# Patient Record
Sex: Male | Born: 1935 | Race: White | Hispanic: No | Smoking: Former smoker
Health system: Southern US, Community
[De-identification: ages and names within clinical notes are randomized; demographics above are authoritative.]

## PROBLEM LIST (undated history)

## (undated) DIAGNOSIS — K259 Gastric ulcer, unspecified as acute or chronic, without hemorrhage or perforation: Secondary | ICD-10-CM

## (undated) DIAGNOSIS — I10 Essential (primary) hypertension: Secondary | ICD-10-CM

## (undated) DIAGNOSIS — I771 Stricture of artery: Secondary | ICD-10-CM

## (undated) DIAGNOSIS — Z8679 Personal history of other diseases of the circulatory system: Secondary | ICD-10-CM

## (undated) DIAGNOSIS — I2510 Atherosclerotic heart disease of native coronary artery without angina pectoris: Secondary | ICD-10-CM

## (undated) DIAGNOSIS — R200 Anesthesia of skin: Secondary | ICD-10-CM

## (undated) DIAGNOSIS — I714 Abdominal aortic aneurysm, without rupture, unspecified: Secondary | ICD-10-CM

## (undated) DIAGNOSIS — E785 Hyperlipidemia, unspecified: Secondary | ICD-10-CM

## (undated) DIAGNOSIS — J449 Chronic obstructive pulmonary disease, unspecified: Secondary | ICD-10-CM

## (undated) DIAGNOSIS — I6529 Occlusion and stenosis of unspecified carotid artery: Secondary | ICD-10-CM

## (undated) DIAGNOSIS — IMO0001 Reserved for inherently not codable concepts without codable children: Secondary | ICD-10-CM

## (undated) DIAGNOSIS — K219 Gastro-esophageal reflux disease without esophagitis: Secondary | ICD-10-CM

## (undated) DIAGNOSIS — I701 Atherosclerosis of renal artery: Secondary | ICD-10-CM

## (undated) DIAGNOSIS — E119 Type 2 diabetes mellitus without complications: Secondary | ICD-10-CM

## (undated) DIAGNOSIS — I739 Peripheral vascular disease, unspecified: Secondary | ICD-10-CM

## (undated) HISTORY — PX: CAROTID ENDARTERECTOMY: SUR193

## (undated) HISTORY — DX: Hyperlipidemia, unspecified: E78.5

## (undated) HISTORY — PX: CORONARY ARTERY BYPASS GRAFT: SHX141

## (undated) HISTORY — DX: Stricture of artery: I77.1

## (undated) HISTORY — PX: TONSILLECTOMY: SUR1361

## (undated) HISTORY — PX: EYE SURGERY: SHX253

## (undated) HISTORY — PX: ABDOMINAL AORTIC ANEURYSM REPAIR: SUR1152

## (undated) HISTORY — DX: Peripheral vascular disease, unspecified: I73.9

## (undated) HISTORY — DX: Atherosclerosis of renal artery: I70.1

## (undated) HISTORY — PX: COLONOSCOPY: SHX174

## (undated) HISTORY — DX: Atherosclerotic heart disease of native coronary artery without angina pectoris: I25.10

## (undated) HISTORY — DX: Type 2 diabetes mellitus without complications: E11.9

## (undated) HISTORY — DX: Chronic obstructive pulmonary disease, unspecified: J44.9

## (undated) HISTORY — DX: Essential (primary) hypertension: I10

## (undated) HISTORY — DX: Abdominal aortic aneurysm, without rupture, unspecified: I71.40

## (undated) HISTORY — DX: Occlusion and stenosis of unspecified carotid artery: I65.29

## (undated) HISTORY — DX: Abdominal aortic aneurysm, without rupture: I71.4

## (undated) HISTORY — DX: Personal history of other diseases of the circulatory system: Z86.79

---

## 1992-12-15 HISTORY — PX: BASAL CELL CARCINOMA EXCISION: SHX1214

## 1999-11-19 ENCOUNTER — Encounter: Admission: RE | Admit: 1999-11-19 | Discharge: 2000-02-17 | Payer: Self-pay | Admitting: Family Medicine

## 1999-11-26 ENCOUNTER — Encounter: Admission: RE | Admit: 1999-11-26 | Discharge: 1999-11-26 | Payer: Self-pay | Admitting: *Deleted

## 1999-11-26 ENCOUNTER — Encounter: Payer: Self-pay | Admitting: *Deleted

## 2002-03-10 ENCOUNTER — Emergency Department (HOSPITAL_COMMUNITY): Admission: EM | Admit: 2002-03-10 | Discharge: 2002-03-10 | Payer: Self-pay

## 2002-04-05 ENCOUNTER — Encounter: Admission: RE | Admit: 2002-04-05 | Discharge: 2002-04-05 | Payer: Self-pay | Admitting: Family Medicine

## 2002-04-05 ENCOUNTER — Encounter: Payer: Self-pay | Admitting: Family Medicine

## 2002-04-11 ENCOUNTER — Encounter: Admission: RE | Admit: 2002-04-11 | Discharge: 2002-04-11 | Payer: Self-pay

## 2003-11-02 ENCOUNTER — Encounter: Admission: RE | Admit: 2003-11-02 | Discharge: 2003-11-02 | Payer: Self-pay | Admitting: Cardiology

## 2004-10-16 ENCOUNTER — Ambulatory Visit: Payer: Self-pay | Admitting: *Deleted

## 2004-10-21 ENCOUNTER — Ambulatory Visit: Payer: Self-pay | Admitting: *Deleted

## 2004-11-14 ENCOUNTER — Ambulatory Visit (HOSPITAL_COMMUNITY): Admission: RE | Admit: 2004-11-14 | Discharge: 2004-11-14 | Payer: Self-pay | Admitting: Cardiology

## 2004-11-25 ENCOUNTER — Ambulatory Visit: Payer: Self-pay | Admitting: Cardiology

## 2004-12-27 ENCOUNTER — Ambulatory Visit (HOSPITAL_COMMUNITY): Admission: RE | Admit: 2004-12-27 | Discharge: 2004-12-27 | Payer: Self-pay | Admitting: *Deleted

## 2005-01-14 ENCOUNTER — Encounter (INDEPENDENT_AMBULATORY_CARE_PROVIDER_SITE_OTHER): Payer: Self-pay | Admitting: *Deleted

## 2005-01-14 ENCOUNTER — Inpatient Hospital Stay (HOSPITAL_COMMUNITY): Admission: RE | Admit: 2005-01-14 | Discharge: 2005-01-17 | Payer: Self-pay | Admitting: *Deleted

## 2005-01-14 HISTORY — PX: FEMORAL-FEMORAL BYPASS GRAFT: SHX936

## 2005-01-14 HISTORY — PX: FEMORAL ARTERY ANEURYSM REPAIR: SUR1157

## 2005-11-18 ENCOUNTER — Ambulatory Visit: Payer: Self-pay | Admitting: Cardiology

## 2005-11-27 ENCOUNTER — Ambulatory Visit: Payer: Self-pay

## 2005-12-31 ENCOUNTER — Emergency Department (HOSPITAL_COMMUNITY): Admission: EM | Admit: 2005-12-31 | Discharge: 2006-01-01 | Payer: Self-pay | Admitting: Emergency Medicine

## 2006-09-15 ENCOUNTER — Ambulatory Visit: Payer: Self-pay | Admitting: Family Medicine

## 2006-09-22 ENCOUNTER — Ambulatory Visit: Payer: Self-pay | Admitting: Family Medicine

## 2006-10-06 ENCOUNTER — Ambulatory Visit: Payer: Self-pay | Admitting: Internal Medicine

## 2006-10-20 ENCOUNTER — Encounter (INDEPENDENT_AMBULATORY_CARE_PROVIDER_SITE_OTHER): Payer: Self-pay | Admitting: *Deleted

## 2006-10-20 ENCOUNTER — Ambulatory Visit: Payer: Self-pay | Admitting: Internal Medicine

## 2006-11-11 ENCOUNTER — Ambulatory Visit: Payer: Self-pay | Admitting: Cardiology

## 2006-11-19 ENCOUNTER — Ambulatory Visit: Payer: Self-pay

## 2007-03-02 ENCOUNTER — Ambulatory Visit: Payer: Self-pay | Admitting: Family Medicine

## 2007-03-09 ENCOUNTER — Encounter: Admission: RE | Admit: 2007-03-09 | Discharge: 2007-03-09 | Payer: Self-pay | Admitting: Family Medicine

## 2007-10-13 ENCOUNTER — Ambulatory Visit: Payer: Self-pay | Admitting: Cardiology

## 2007-12-22 ENCOUNTER — Ambulatory Visit: Payer: Self-pay | Admitting: Family Medicine

## 2007-12-23 ENCOUNTER — Ambulatory Visit: Payer: Self-pay | Admitting: *Deleted

## 2007-12-23 ENCOUNTER — Ambulatory Visit: Payer: Self-pay

## 2008-01-20 ENCOUNTER — Ambulatory Visit: Payer: Self-pay | Admitting: Family Medicine

## 2008-06-07 ENCOUNTER — Ambulatory Visit: Payer: Self-pay

## 2008-11-01 ENCOUNTER — Encounter: Payer: Self-pay | Admitting: Cardiology

## 2008-11-02 ENCOUNTER — Ambulatory Visit: Payer: Self-pay | Admitting: Cardiology

## 2008-11-13 ENCOUNTER — Ambulatory Visit: Payer: Self-pay

## 2009-01-10 ENCOUNTER — Ambulatory Visit: Payer: Self-pay | Admitting: Family Medicine

## 2009-01-17 ENCOUNTER — Ambulatory Visit: Payer: Self-pay | Admitting: *Deleted

## 2009-03-05 ENCOUNTER — Ambulatory Visit: Payer: Self-pay | Admitting: Family Medicine

## 2009-03-27 ENCOUNTER — Telehealth (INDEPENDENT_AMBULATORY_CARE_PROVIDER_SITE_OTHER): Payer: Self-pay | Admitting: *Deleted

## 2009-04-02 ENCOUNTER — Ambulatory Visit: Payer: Self-pay | Admitting: Family Medicine

## 2009-04-13 DIAGNOSIS — E785 Hyperlipidemia, unspecified: Secondary | ICD-10-CM | POA: Insufficient documentation

## 2009-04-13 DIAGNOSIS — I739 Peripheral vascular disease, unspecified: Secondary | ICD-10-CM

## 2009-04-13 DIAGNOSIS — E118 Type 2 diabetes mellitus with unspecified complications: Secondary | ICD-10-CM | POA: Insufficient documentation

## 2009-04-13 DIAGNOSIS — I10 Essential (primary) hypertension: Secondary | ICD-10-CM | POA: Insufficient documentation

## 2009-04-13 DIAGNOSIS — I6523 Occlusion and stenosis of bilateral carotid arteries: Secondary | ICD-10-CM

## 2009-04-13 DIAGNOSIS — I2510 Atherosclerotic heart disease of native coronary artery without angina pectoris: Secondary | ICD-10-CM | POA: Insufficient documentation

## 2009-04-16 ENCOUNTER — Ambulatory Visit: Payer: Self-pay

## 2009-04-16 ENCOUNTER — Ambulatory Visit: Payer: Self-pay | Admitting: Cardiology

## 2009-04-16 ENCOUNTER — Telehealth: Payer: Self-pay | Admitting: Cardiology

## 2009-04-26 ENCOUNTER — Ambulatory Visit: Payer: Self-pay | Admitting: Family Medicine

## 2009-05-03 ENCOUNTER — Encounter: Payer: Self-pay | Admitting: Cardiology

## 2009-08-06 ENCOUNTER — Ambulatory Visit: Payer: Self-pay | Admitting: Family Medicine

## 2009-10-15 ENCOUNTER — Encounter (INDEPENDENT_AMBULATORY_CARE_PROVIDER_SITE_OTHER): Payer: Self-pay | Admitting: *Deleted

## 2009-10-16 ENCOUNTER — Encounter: Payer: Self-pay | Admitting: Cardiology

## 2009-10-17 ENCOUNTER — Ambulatory Visit: Payer: Self-pay | Admitting: Cardiology

## 2009-10-17 ENCOUNTER — Ambulatory Visit: Payer: Self-pay

## 2009-10-17 DIAGNOSIS — I714 Abdominal aortic aneurysm, without rupture, unspecified: Secondary | ICD-10-CM | POA: Insufficient documentation

## 2009-11-07 ENCOUNTER — Encounter: Payer: Self-pay | Admitting: Cardiology

## 2009-11-07 ENCOUNTER — Ambulatory Visit: Payer: Self-pay

## 2009-11-07 DIAGNOSIS — I701 Atherosclerosis of renal artery: Secondary | ICD-10-CM

## 2010-02-20 ENCOUNTER — Ambulatory Visit: Payer: Self-pay | Admitting: Family Medicine

## 2010-04-18 ENCOUNTER — Ambulatory Visit: Payer: Self-pay

## 2010-04-18 ENCOUNTER — Encounter: Payer: Self-pay | Admitting: Cardiology

## 2010-06-13 ENCOUNTER — Ambulatory Visit: Payer: Self-pay | Admitting: Family Medicine

## 2010-06-20 ENCOUNTER — Ambulatory Visit: Payer: Self-pay | Admitting: Family Medicine

## 2010-06-27 ENCOUNTER — Ambulatory Visit: Payer: Self-pay | Admitting: Family Medicine

## 2010-07-01 ENCOUNTER — Ambulatory Visit: Payer: Self-pay | Admitting: Family Medicine

## 2010-10-01 ENCOUNTER — Ambulatory Visit: Payer: Self-pay | Admitting: Family Medicine

## 2010-10-01 ENCOUNTER — Encounter: Admission: RE | Admit: 2010-10-01 | Discharge: 2010-10-01 | Payer: Self-pay | Admitting: Family Medicine

## 2010-10-08 DIAGNOSIS — I2510 Atherosclerotic heart disease of native coronary artery without angina pectoris: Secondary | ICD-10-CM

## 2010-10-09 ENCOUNTER — Ambulatory Visit: Payer: Self-pay | Admitting: Cardiology

## 2010-10-11 ENCOUNTER — Encounter: Payer: Self-pay | Admitting: Cardiology

## 2010-11-13 ENCOUNTER — Encounter: Payer: Self-pay | Admitting: Cardiology

## 2010-11-14 ENCOUNTER — Ambulatory Visit: Payer: Self-pay

## 2010-11-14 ENCOUNTER — Telehealth (INDEPENDENT_AMBULATORY_CARE_PROVIDER_SITE_OTHER): Payer: Self-pay | Admitting: *Deleted

## 2010-11-14 ENCOUNTER — Encounter: Payer: Self-pay | Admitting: Cardiology

## 2010-11-26 ENCOUNTER — Ambulatory Visit: Payer: Self-pay | Admitting: Cardiology

## 2010-11-26 ENCOUNTER — Ambulatory Visit: Payer: Self-pay | Admitting: Family Medicine

## 2010-11-28 LAB — CONVERTED CEMR LAB
Alkaline Phosphatase: 66 units/L (ref 39–117)
Bilirubin, Direct: 0.1 mg/dL (ref 0.0–0.3)
CO2: 28 meq/L (ref 19–32)
Calcium: 9.5 mg/dL (ref 8.4–10.5)
Creatinine, Ser: 1.2 mg/dL (ref 0.4–1.5)
GFR calc non Af Amer: 61.51 mL/min (ref >60.00)
Glucose, Bld: 151 mg/dL — ABNORMAL HIGH (ref 70–99)
LDL Cholesterol: 117 mg/dL — ABNORMAL HIGH (ref 0–99)
Total Bilirubin: 0.6 mg/dL (ref 0.3–1.2)
Total CHOL/HDL Ratio: 6

## 2011-01-04 ENCOUNTER — Encounter: Payer: Self-pay | Admitting: Cardiology

## 2011-01-16 NOTE — Miscellaneous (Signed)
Summary: Orders Update  Clinical Lists Changes  Orders: Added new Test order of Carotid Duplex (Carotid Duplex) - Signed 

## 2011-01-16 NOTE — Assessment & Plan Note (Signed)
Summary: rov. gd   Visit Type:  rov Primary Provider:  Sharlot Gowda MD  CC:  sob...Chad Kitchenpt states he had a little tingling feeling in his LUQ recently but says this is better now....denies any edema.  History of Present Illness: Mr. Billey returns today for evaluation and management of his complex vascular history. Please refer to his problem list.  He is less depressed the last time I saw him. He has a male companion which seems to have helped. He has lost 20 some pounds.  He denies any angina or ischemic symptoms. He's had no claudication or mini stroke symptoms. Vascular studies were recently stable. Refer to those reports.  His high-dose simvastatin we'll stop the other day by primary care. Apparently his creatinine has worsened as well.  Clinical Reports Reviewed:  Carotid Doppler:  04/18/2010:  Impressions: Stable, moderate carotid artery disease, bilaterally. 40-59% RICA stenosis. 60-79% LICA stenosis.  Charlton Haws, MD  04/16/2009:  Impressions: Moderate irregular plaque throughout both carotid arteries, with progression of stenosis, bilaterally, which could be an embolic source. 40-59% RICA stenosis 60-79% LICA stenosis  Charlton Haws, MD  06/07/2008:  Impressions: Stable, moderate, irregular, calcified plaque with shadowing, bilaterally. Severly elevated ECA velocities, bilaterally. 0-39% RICA stenosis 40-59% LICA stenosis  Tonny Bollman, MD  12/23/2007:  Impressions: Moderate calcified irregular plaque in the bulb and bifurcation, bilaterally. Severely elevated ECA velocities, bilaterally. 0-39% RICA stenosis 60-79% LICA stenosis  Lenice Llamas, MD   Current Medications (verified): 1)  Diltiazem Hcl 60 Mg Tabs (Diltiazem Hcl) .[name].. 1 Tab Once Daily 2)  Niaspan 500 Mg Cr-Tabs (Niacin (Antihyperlipidemic)) .[name].. 1 Tab At Bedtime 3)  Aspirin Ec 325 Mg Tbec (Aspirin) .... Take One Tablet By Mouth Daily 4)  Multivitamins   Tabs (Multiple Vitamin) .[name].. 1 Tab Once  Daily 5)  Enalapril Maleate 20 Mg Tabs (Enalapril Maleate) .[name].. 1 Tab Once Daily 6)  Metformin Hcl 1000 Mg Tabs (Metformin Hcl) .[name].. 1 Tab Two Times A Day 7)  Nitroglycerin 0.4 Mg Subl (Nitroglycerin) .... One Tablet Under Tongue Every 5 Minutes As Needed For Chest Pain---May Repeat Times Three 8)  Novolin R 100 Unit/ml Soln (Insulin Regular Human) .... Use As Directed  Allergies (verified): No Known Drug Allergies  Past History:  Past Medical History: Last updated: 10/08/2010 CAD, NATIVE VESSEL (ICD-414.01) RENAL ARTERY ATHEROSCLEROSIS (ICD-440.1) ABDOMINAL AORTIC ANEURYSM (ICD-441.4) CAROTID ARTERY STENOSIS (ICD-433.10) ATHEROSCLEROTIC CARDIOVASCULAR DISEASE (ICD-429.2) HYPERTENSION, UNSPECIFIED (ICD-401.9) ARTERIAL STENOSIS (ICD-447.1) PVD (ICD-443.9) HYPERLIPIDEMIA-MIXED (ICD-272.4) DM (ICD-250.00)  Past Surgical History: Last updated: 04-May-2009 Resection of left common femoral artery aneurysm.  01/14/2005 Left-to-right femoral-femoral bypass.  01/14/2005 Tonsillectomy s/p basal cell excision from left lower eyelid... 1994 Coronary artery bypass graft  Family History: Last updated: 05-04-2009 Mother: deceased at 81...melanoma.Chad Kitchenalso had heart disease and non isulin diabetes as well as ETOH abuser Father: deceased at 6...hx of heart disease Family History of Diabetes: maternal cousins Family History of Alcoholism: mother  Social History: Last updated: 05-04-2009 Full Time Tobacco Use - Former.  Alcohol Use - yes  Risk Factors: Smoking Status: quit (2009/05/04)  Review of Systems       negative other than history of present illness  Vital Signs:  Patient profile:   77 year old male Height:      71 inches Weight:      217.8 pounds BMI:     30.49 Pulse rate:   69 / minute Pulse rhythm:   irregular BP sitting:   108 / 56  (left arm) Cuff size:  large  Vitals Entered By: Danielle Rankin, CMA (October 09, 2010 10:35 AM)  Physical Exam  General:  Well  developed, well nourished, in no acute distress. Head:  normocephalic and atraumatic Eyes:  PERRLA/EOM intact; conjunctiva and lids normal. Neck:  Neck supple, no JVD. No masses, thyromegaly or abnormal cervical nodes. Chest Wall:  no deformities or breast masses noted Lungs:  Clear bilaterally to auscultation and percussion. Heart:  MI nondisplaced, soft S1-S2, no murmur rub or gallop, soft carotid bruits Abdomen:  incisional hernia, positive bowel sounds, no obvious bruits Msk:  decreased ROM.   Pulses:  dorsalis pedis 2+ over 4+ bilaterally Extremities:  No clubbing or cyanosis. Neurologic:  Alert and oriented x 3. Skin:  Intact without lesions or rashes. Psych:  Normal affect.   EKG  Procedure date:  10/09/2010  Findings:      normal sinus rhythm, nonspecific ST and T-wave changes.  Impression & Recommendations:  Problem # 1:  CAD, NATIVE VESSEL (ICD-414.01) Assessment Unchanged  His updated medication list for this problem includes:    Diltiazem Hcl 60 Mg Tabs (Diltiazem hcl) .[name]... 1 tab once daily    Aspirin Ec 325 Mg Tbec (Aspirin) .[name]... Take one tablet by mouth daily    Enalapril Maleate 20 Mg Tabs (Enalapril maleate) .[name]... 1 tab once daily    Nitroglycerin 0.4 Mg Subl (Nitroglycerin) ..... One tablet under tongue every 5 minutes as needed for chest pain---may repeat times three  Orders: EKG w/ Interpretation (93000)  Problem # 2:  RENAL ARTERY ATHEROSCLEROSIS (ICD-440.1) Assessment: Unchanged  Problem # 3:  ABDOMINAL AORTIC ANEURYSM (ICD-441.4)  Problem # 4:  CAROTID ARTERY STENOSIS (ICD-433.10) Assessment: Unchanged repeat carotids in May of 2012 His updated medication list for this problem includes:    Aspirin Ec 325 Mg Tbec (Aspirin) .[name]... Take one tablet by mouth daily  Problem # 5:  PVD (ICD-443.9) Assessment: Unchanged  Problem # 6:  HYPERTENSION, UNSPECIFIED (ICD-401.9) Assessment: Improved  His updated medication list for this problem includes:     Diltiazem Hcl 60 Mg Tabs (Diltiazem hcl) .[name]... 1 tab once daily    Aspirin Ec 325 Mg Tbec (Aspirin) .[name]... Take one tablet by mouth daily    Enalapril Maleate 20 Mg Tabs (Enalapril maleate) .[name]... 1 tab once daily  Problem # 7:  HYPERLIPIDEMIA-MIXED (ICD-272.4) Assessment: Deteriorated Will restart him on Lipitor 40 mg a day. Followup labs in 6-8 weeks. The following medications were removed from the medication list:    Simvastatin 80 Mg Tabs (Simvastatin) .[name]... 1 tab at bedtime His updated medication list for this problem includes:    Niaspan 500 Mg Cr-tabs (Niacin (antihyperlipidemic)) .[name]... 1 tab at bedtime    Lipitor 40 Mg Tabs (Atorvastatin calcium) .[name]... Take 1 tablet at bedtime  Problem # 8:  DM (ICD-250.00) Assessment: Deteriorated  The following medications were removed from the medication list:    Glyburide 5 Mg Tabs (Glyburide) .[name]... 2 tabs bid His updated medication list for this problem includes:    Aspirin Ec 325 Mg Tbec (Aspirin) .[name]... Take one tablet by mouth daily    Enalapril Maleate 20 Mg Tabs (Enalapril maleate) .[name]... 1 tab once daily    Metformin Hcl 1000 Mg Tabs (Metformin hcl) .[name]... 1 tab two times a day    Novolin R 100 Unit/ml Soln (Insulin regular human) ..... Use as directed  Patient Instructions: 1)  Your physician recommends that you schedule a follow-up appointment in: 6 months with Dr. Daleen Squibb 2)  Your physician  recommends that you return for a FASTING lipid profile, liver, bmp in 6-8 weeks.  3)  Your physician has recommended you make the following change in your medication: Start Lipitor 4)  Your physician has requested that you have a carotid duplex. This test is an ultrasound of the carotid arteries in your neck. It looks at blood flow through these arteries that supply the brain with blood. Allow one hour for this exam. There are no restrictions or special instructions.  SCHEDULE FOR MAY 2012 Prescriptions: LIPITOR 40 MG TABS (ATORVASTATIN CALCIUM) Take  1 tablet at bedtime  #30 x 11   Entered by:   Lisabeth Devoid RN   Authorized by:   Gaylord Shih, MD, [facility]   Signed by:   Lisabeth Devoid RN on 10/09/2010   Method used:   Electronically to        [facility]  [address] [id number] (retail)       [address]       [place], [place]  [address]       Ph: [phone]       Fax: [phone]   RxID:   [id number]

## 2011-01-16 NOTE — Progress Notes (Signed)
  Walk in Patient Form Recieved " Pt has questions reguarding Labs" sent to [facility]  November 14, 2010 2:24 PM     Appended Document:  I talked with pt and he is aware he needs follow-up lipids & liver since starting Lipitor in late October.  He will keep 11/26/10 appt. Mylo Red RN

## 2011-02-17 ENCOUNTER — Ambulatory Visit (INDEPENDENT_AMBULATORY_CARE_PROVIDER_SITE_OTHER): Payer: Medicare Other | Admitting: Family Medicine

## 2011-02-17 DIAGNOSIS — Z79899 Other long term (current) drug therapy: Secondary | ICD-10-CM

## 2011-02-17 DIAGNOSIS — E119 Type 2 diabetes mellitus without complications: Secondary | ICD-10-CM

## 2011-02-17 DIAGNOSIS — L258 Unspecified contact dermatitis due to other agents: Secondary | ICD-10-CM

## 2011-02-17 DIAGNOSIS — E785 Hyperlipidemia, unspecified: Secondary | ICD-10-CM

## 2011-03-27 ENCOUNTER — Ambulatory Visit (INDEPENDENT_AMBULATORY_CARE_PROVIDER_SITE_OTHER): Payer: Medicare Other | Admitting: Family Medicine

## 2011-03-27 DIAGNOSIS — Z79899 Other long term (current) drug therapy: Secondary | ICD-10-CM

## 2011-03-27 DIAGNOSIS — E782 Mixed hyperlipidemia: Secondary | ICD-10-CM

## 2011-03-27 DIAGNOSIS — I10 Essential (primary) hypertension: Secondary | ICD-10-CM

## 2011-03-27 DIAGNOSIS — E119 Type 2 diabetes mellitus without complications: Secondary | ICD-10-CM

## 2011-04-29 ENCOUNTER — Other Ambulatory Visit: Payer: Self-pay | Admitting: Cardiology

## 2011-04-29 DIAGNOSIS — I6529 Occlusion and stenosis of unspecified carotid artery: Secondary | ICD-10-CM

## 2011-04-29 NOTE — Procedures (Signed)
LOWER EXTREMITY ARTERIAL EVALUATION-SINGLE LEVEL   INDICATION:  Complaining of both feet feeling cold at night.  He denies  claudication.   HISTORY:  Diabetes:  Yes, on insulin.  Cardiac:  CABG in 1996 by Dr. Tyrone Sage.  Hypertension:  Yes.  Smoking:  No.  Previous Surgery:  Aortoiliac bypass graft in 1996 by Dr. Madilyn Fireman,  resection of left common femoral artery aneurysm, left-to-right femoral-  to-femoral bypass graft by Dr. Madilyn Fireman.   OTHER REFERRING PHYSICIAN:  Sharlot Gowda, M.D.   RESTING SYSTOLIC PRESSURES: (ABI)                          RIGHT                LEFT  Brachial:               140                  140  Anterior tibial:        132                  124  Posterior tibial:       164 (>1.0)           134 (0.96)  Peroneal:  DOPPLER WAVEFORM ANALYSIS:  Anterior tibial:        Biphasic             Biphasic  Posterior tibial:       Triphasic            Triphasic  Peroneal:   PREVIOUS ABI'S:  Date: 03/09/2007  RIGHT:  >1.0  LEFT:  >1.0  This study was done at [facility].   IMPRESSION:  No evidence of lower extremity arterial occlusive disease  bilaterally.   ___________________________________________  P. Liliane Bade, M.D.   DP/MEDQ  D:  12/23/2007  T:  12/23/2007  Job:  [id number]

## 2011-04-29 NOTE — Assessment & Plan Note (Signed)
[facility]                            CARDIOLOGY OFFICE NOTE   NAME:Strauser, ENCARNACION SCIONEAUX                        MRN:          [id number]  DATE:10/13/2007                            DOB:          October 25, 1930    Mr. [name] returns today for management of the following issues:  1. Coronary artery disease.  He is having no angina.  He does have      some dyspnea on exertion which is baseline.  Stress Myoview on      November 19, 2006 showed an EF of 48%, inferior wall and septum      scar.  No ischemia.  2. Peripheral vascular disease.  He has had an aortobifemoral and      bifemoral renal artery grafting by Dr. Liliane Bade in January, 2006.      He also has had a left-to-right femoral-femoral bypass to include a      right iliac limb.  He has been having some cold sensation in his      left foot.  It hurts when he lays down at night.  It does not seem      to bother him too much up and about.  3. Mixed hyperlipidemia, followed by Dr. Susann Givens.  4. Type 2 diabetes.  Recent blood work for his lipids and both his      hemoglobin A1C at the [facility] showed a total cholesterol of      149, blood sugar fasting at 157, HCL of 29, LDL of 80,      triglycerides of 199.  His hemoglobin A1C was 7.5%.   MEDICATIONS:  1. Diltiazem 60 mg a day.  2. Metformin 500 mg b.i.d.  3. Vytorin 10/80 nightly.  4. Niaspan 1000 mg nightly.  5. Glyburide 10 mg p.o. b.i.d.  6. Insulin.  7. Omega 3.  8. Enalapril 20 mg a day.  9. Aspirin 81 mg a day nightly.  10.Multivitamin.   He also has a dry cough, but it does not keep him awake and bother him  during the day.  It seems to bother him most in the morning when he  first gets up.  He also has nonobstructive carotid disease that was  picked up on exam last year.  His carotid Dopplers showed nonobstructive  plaque in both internal carotid arteries as well as antegrade flow in  both vertebrals.   PHYSICAL EXAMINATION:  Blood  pressure 118/70, pulse 76 and regular.  His  weight is 233.  Respiratory rate is 18.  Alert and oriented.  HEENT:  Normocephalic and atraumatic.  PERRLA.  Extraocular movements  are intact.  Skin is warm and dry.  His skin color looks the best I have  seen it.  HEENT otherwise unremarkable.  Carotid upstrokes are equal bilaterally with a soft carotid bruit on the  right side.  Thyroid is not enlarged.  Trachea is midline.  LUNGS:  Clear.  His sternotomy site is well healed.  He has a soft S1 and S2.  PMI is hard to appreciate.  ABDOMEN:  Soft with  good bowel sounds.  There is no tenderness.  No  hepatomegaly.  EXTREMITIES:  His toes are slightly cool.  I cannot appreciate a good  dorsalis pedis or posterior tibial pulse on the left.  He has minimal  edema.  There are no varicosities.  There is no DVT.  His right lower  extremity is the same temperature, but he has at least a 1-2+ dorsalis  pedis pulse.  Posterior tibial was at least 1+.  NEURO:  Intact.   ASSESSMENT/PLAN:  From a cardiac standpoint, Mr. Portal is stable.  He  needs carotid Dopplers in December.  I have advised him to go back to  see Dr. Liliane Bade about his left foot and leg.  I do not see any acute  issue but certainly, he needs followup.   I will plan on seeing him back again in a year.     Thomas C. Daleen Squibb, MD, [facility]  Electronically Signed    TCW/MedQ  DD: 10/13/2007  DT: 10/14/2007  Job #: [id number]   cc:   Sharlot Gowda, M.D.  Balinda Quails, M.D.

## 2011-04-29 NOTE — Assessment & Plan Note (Signed)
[facility]                            CARDIOLOGY OFFICE NOTE   NAME:Mcneice, WALLIS VANCOTT                        MRN:          [id number]  DATE:11/02/2008                            DOB:          02-29-1930    Mr. Yurkovich comes in today for followup.   His biggest complaint is that of domestic stress.  His child lost their  job, moved back into their home with 3 children.  He has been extremely  stressful.   He denies any angina or ischemic symptoms.  He did follow up with Dr.  Madilyn Fireman as I suggested on his last visit on October 13, 2007, concerning  his peripheral vascular disease.   His problems are listed on my note, October 13, 2007.  He  went to the  [facility] in [place] yesterday and had fasting blood work done.  Obviously,  he does not have the results nor do I.   His medications since last year I have changed in that he is no longer  on Vytorin.  I do not see an appropriate substitute from his med list.  [facility] or [facility].   He is now on metformin 1000 b.i.d. as opposed to 500 b.i.d. and his  enalapril has been bumped up to 40 mg at    INCOMPLETE DICTATION.     Thomas C. Daleen Squibb, MD, [facility]  Electronically Signed    TCW/MedQ  DD: 11/02/2008  DT: 11/02/2008  Job #: [id number]

## 2011-04-30 ENCOUNTER — Encounter: Payer: Medicare Other | Admitting: Cardiology

## 2011-05-02 NOTE — Op Note (Signed)
NAMEBECKY, BERBERIAN NO.:  [id number]   MEDICAL RECORD NO.:  [id number]          PATIENT TYPE:  INP   LOCATION:  3302                         FACILITY:  [facility]   PHYSICIAN:  Balinda Quails, M.D.    DATE OF BIRTH:  September 10, 1930   DATE OF PROCEDURE:  01/14/2005  DATE OF DISCHARGE:                                 OPERATIVE REPORT   SURGEON:  Balinda Quails, M.D.   ASSISTANT:  Toribio Harbour, N.P.   ANESTHESIA:  General endotracheal.   ANESTHESIOLOGIST:  Jean Rosenthal.   PREOPERATIVE DIAGNOSES:  1.  Left common femoral artery aneurysm.  2.  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   PROCEDURE:  1.  Resection of left common femoral artery aneurysm.  2.  Left-to-right femoral-femoral bypass.   CLINICAL NOTE:  Mr. Sergio pain is a 77 year old male with a history of  abdominal aortic aneurysm repair approximately 10 years ago. At that time,  he had an aortobi-iliac graft placed. He was recently seen in the office  with new onset of right lower extremity claudication. Arteriography revealed  occlusion of the right limb of his aortofemoral graft along with a small  left common femoral artery aneurysm. He is brought to the operating this  time for repair of left common femoral artery aneurysm and left-to-right  femoral-femoral bypass for relief of claudication.   OPERATIVE PROCEDURE:  The patient was brought to the operating in a stable  condition. Placed in supine position.  General endotracheal anesthesia  induced. The abdomen and both legs prepped and draped in sterile fashion.   Bilateral oblique groin incisions were made at the inguinal ligament.  Dissection carried down through subcutaneous tissue and lymphatics with  electrocautery.   The left groin dissection was carried down to expose the left common femoral  artery. The ligament freed and the external oblique aponeurosis exposed.  There was an approximately 3 cm aneurysm in the left common  femoral artery.  The proximal left common femoral artery encircled with a vessel loop. Distal  dissection carried down to expose the origin of the profunda and superficial  femoral arteries, which also were encircled with vessel loops.   In the right groin, deep dissection was carried through the lymphatics and  subcutaneous tissue to expose the right common femoral artery. This revealed  no palpable pulse. The artery was normal in caliber. The distal right common  femoral artery was freed. The origin of the profunda and superficial femoral  arteries were also freed. The proximal right superficial femoral artery was  exposed down to the first branch.   A subcutaneous suprapubic tunnel was then made between the two incisions. An  8 mm Dacron Hemashield graft was placed in the tunnel. The patient  administered 7000 units heparin intravenously. The left femoral vessel was  controlled with clamps. The left common femoral artery was opened  longitudinally. The aneurysmal segment was resected down to normal wall. The  left limb of the fem-fem graft was beveled and anastomosed end-to-side to  the left common femoral artery using running 5-0 Prolene  suture. At the  completion of the femoral anastomosis, the vessels were all flushed, clamps  removed, and the graft controlled with a fistula clamp.   In the right groin, the femoral vessels controlled with clamps. The  longitudinal arteriotomy made. The distal right common femoral artery  extended across the origin of the right superficial femoral artery. The  right limb of the graft beveled and anastomosed end-to-side to the right  common femoral/superficial femoral artery junction using running 6-0 Prolene  suture. At the completion of this, the graft was flushed. Clamps removed.  Excellent flow present. Adequate hemostasis obtained. Sponge and instrument  counts correct. The patient administered 50 mg of protamine intravenously.  The groin  incisions were irrigated with saline solution.   The groins were closed bilaterally with a deep layer of interrupted 2-0  Vicryl suture. Subcutaneous layer of running two Vicryl suture. Skin closed  with 4-0 Monocryl. Steri-Strips applied.   The patient tolerated procedure well. Transferred to recovery in stable  condition.      PGH/MEDQ  D:  01/14/2005  T:  01/14/2005  Job:  [id number]   cc:   Thomas C. Wall, M.D.

## 2011-05-02 NOTE — H&P (Signed)
NAMEGAYLIN, [name] NO.:  [id number]   MEDICAL RECORD NO.:  [id number]          PATIENT TYPE:  INP   LOCATION:  NA                           FACILITY:  [facility]   PHYSICIAN:  [name], M.D.    DATE OF BIRTH:  March 19, 1930   DATE OF ADMISSION:  01/14/2005  DATE OF DISCHARGE:                                HISTORY & PHYSICAL   CARDIOLOGIST:  Dr. Valera Mora   CHIEF COMPLAINT:  Right lower extremity claudication.   HISTORY OF PRESENT ILLNESS:  This is a 77 year old Caucasian male with  peripheral vascular disease.  He is status post abdominal aortic aneurysm  repair with bilateral common iliac aneurysm repair with an aorto biiliac  bypass graft as well as a left aortorenal bypass in 1996.  The left  aortorenal bypass subsequently occluded and the patient now has an atrophic  left kidney.  Patient began to complain of right lower extremity  claudication which has been going on for a little more than a year including  right hip pain with ambulation.  The patient denies rest pain.  ABIs  performed on November 25, 2004 reveal 0.58 on the right and greater than 1  on the left.  There are monophasic wave forms throughout the right lower  extremity.  He is status post coronary artery bypass grafting in 1996 by Dr.  Tyrone Mora.  His stress Cardiolite performed in 2004 revealed an ejection  fraction of 60% and no ischemia.  An abdominal and pelvic CT scan revealed  the aneurysm repairs were intact and that there was an occlusion of the  proximal right common iliac artery graft with reconstitution distally.  An  aortogram was then performed on December 27, 2004 which also revealed an  occlusion of the right limb of the aorto bi-iliac bypass graft.  Patient  does complain of hip pain with ambulation as well as some low back pain.  He  denies rest pain, erythema, edema, ulcerations, tenderness, fever, chills,  mental status changes, shortness of breath, angina, and  palpitations.  He  does also experience decreased temperature in the right lower extremity.  Patient presents to the CVTS office today for history and physical  examination prior to surgery.   PAST MEDICAL HISTORY:  1.  Peripheral vascular disease.  2.  Coronary artery disease.  3.  Hyperlipidemia.  4.  Non-insulin-dependent diabetes mellitus.  5.  Hypertension.  6.  Atrophic left kidney.  7.  Ventral hernia.  8.  Basal cell carcinoma.   PAST SURGICAL HISTORY:  1.  Status post aorto biiliac bypass graft with an aortorenal bypass in      1996.  2.  Coronary artery bypass grafting in 1996 by Dr. Tyrone Mora.  3.  Basal cell carcinoma removal x2 in the year 2000.  4.  Tonsillectomy.   MEDICATIONS:  1.  Metformin 500 mg t.i.d.  2.  Nifedipine 30 mg daily.  3.  Omeprazole 20 mg daily.  4.  Simvastatin 80 mg q.h.s.  5.  Enalapril 20 mg daily.  6.  Glyburide 10 mg with breakfast.  7.  HCTZ 25 mg one-half tablet daily.  8.  Zetia 10 mg daily.   ALLERGIES:  No known drug allergies.   REVIEW OF SYSTEMS:  Please see HPI for significant positives and negatives.   FAMILY HISTORY:  Mother:  Cardiac disease and melanoma.  Father:  Cardiac  disease.   SOCIAL HISTORY:  This is a married male with five children.  He is retired.  Patient denies alcohol use and quit smoking in 1986 after smoking one-half  to one pack per day for approximately 45 years.   PHYSICAL EXAMINATION:  VITAL SIGNS:  Blood pressure 120/62, pulse 86,  respirations 18.  GENERAL:  This is a 77 year old Caucasian male in no acute distress.  He is  alert and oriented x3.  HEENT:  Normocephalic, atraumatic.  Pupils are equal, round, and reactive to  light and accommodation.  Extraocular movements are intact.  There is no  evidence of cataract, glaucoma, or macular degeneration.  NECK:  Supple with no JVD, no bruits, and no lymphadenopathy.  CHEST:  Symmetrical on inspiration and there is a well healed midline scar   present.  LUNGS:  No wheezes.  No rhonchi.  No rales.  CARDIAC:  Regular rate and rhythm with no murmurs, no rubs, no gallops.  ABDOMEN:  Soft and nontender.  There are bowel sounds auscultated in four  quadrants.  There is a large midline well healed scar present and a ventral  hernia is palpable.  There are no bruits auscultated.  GENITOURINARY:  Deferred.  RECTAL:  Deferred.  EXTREMITIES:  No clubbing.  No cyanosis.  No ulcerations.  There is trace  bilateral lower extremity edema.  The temperature of the left lower  extremity is warm and it is decreased on the right lower extremity.  PULSES:  Carotid 2+ bilaterally.  Femoral is 2+ on the left, not palpable on  the right.  Popliteal and pedal pulses are 2+ on the left and not palpable  on the right.  NEUROLOGIC:  Nonfocal.  The gait is steady.  Deep tendon reflexes are 2+.  Muscle strength is 4/5.   ASSESSMENT:  Occluded right limb of aorto biiliac bypass graft.   PLAN:  Left to right femoral to femoral bypass graft at [facility] on  January 14, 2005 by Dr. Madilyn Mora.  Dr. Madilyn Mora has seen and evaluated this  patient prior to this admission and has explained the risks and benefits of  the procedure.  The patient has agreed to continue.      AY/MEDQ  D:  01/10/2005  T:  01/10/2005  Job:  [id number]

## 2011-05-02 NOTE — Assessment & Plan Note (Signed)
[facility]                            CARDIOLOGY OFFICE NOTE   NAME:Luddy, TYE VIGO                        MRN:          [id number]  DATE:11/11/2006                            DOB:          08-May-1930    Mr. Gaxiola returns today for further management of the following issues:  1. Coronary artery disease.  Other than some dyspnea on exertion he is      asymptomatic.  He had a negative stress Myoview last year.  He has      normal left ventricular systolic function.  2. Peripheral vascular disease, status post aortobifemoral and      bilateral renal artery grafting.  He has also had a left-to-right      femoral-to-femoral bypass for an occluded right iliac limb January 14, 2005.  He saw Dr. Madilyn Fireman earlier this year in followup.  3. Mixed hyperlipidemia.  He brings labs to me from Dr. Jola Babinski      office.  His lipids are very poorly controlled, even though he says      he is taking his Vytorin 10/80 every night, and he is on Niaspan      1000 mg nightly.  Please refer to the labs for numbers.  4. Type 2 diabetes.  His hemoglobin A1c is at an all-time high at      8.0%.  His weight has actually dropped, but he is now on insulin.   MEDICATIONS:  His meds are unchanged since last visit except he is now  on diltiazem 60 mg daily that he gets from the [place], and also on insulin  as directed.   PHYSICAL EXAMINATION:  His blood pressure is 112/58, his pulse is 66 and  regular.  EKG is normal.  HEENT:  Normocephalic, atraumatic.  PERRLA, extraocular movements  intact.  Sclerae are clear.  Facial symmetry is normal.  Carotids are full with a right carotid bruit.  The thyroid is not  enlarged.  Trachea is midline.  LUNGS:  Clear.  HEART:  Reveals a soft S1, S2.  ABDOMEN:  Exam is protuberant with good bowel sounds  He has a midline  hernia.  EXTREMITIES:  Reveal trace edema, pulses are present.  The feet are  warm.  NEUROLOGIC:  Exam is intact.  MUSCULOSKELETAL AND SKIN:  Exam is intact and unremarkable.   ASSESSMENT:  Mr. Ransford is stable, but he has worsening control of his  diabetes and his lipids.  His blood pressure is under good control.   PLAN:  1. Adenosine Myoview for graft surveillance, now 11 years out from      bypass surgery.  2. Carotid Dopplers for new right carotid bruit.   Depending on the findings, we will see him back in a year.  Blood sugar  control and tight weight control were emphasized as usual.     Maisie Fus C. Daleen Squibb, MD, [facility]  Electronically Signed    TCW/MedQ  DD: 11/11/2006  DT: 11/12/2006  Job #: [id number]   cc:   Balinda Quails, M.D.  Sharlot Gowda, M.D.

## 2011-05-02 NOTE — Op Note (Signed)
NAMEAUDY, DAUPHINE NO.:  [id number]   MEDICAL RECORD NO.:  [id number]          PATIENT TYPE:  OIB   LOCATION:  2899                         FACILITY:  [facility]   PHYSICIAN:  Balinda Quails, M.D.    DATE OF BIRTH:  1930-05-21   DATE OF PROCEDURE:  12/27/2004  DATE OF DISCHARGE:                                 OPERATIVE REPORT   DIAGNOSIS:  Right lower extremity claudication.   PROCEDURE:  Abdominal aortogram with bilateral lower extremity runoff  arteriography.   ACCESS:  Left common femoral artery 5 French sheath.   CONTRAST:  140 mL. __________.   COMPLICATIONS:  None apparent.   INDICATIONS FOR PROCEDURE:  Mr. Calvin is a 77 year old male who underwent  abdominal aortic aneurysm repair with an aortobiiliac graft and left renal  artery bypass approximately 10 years ago.  His left renal artery bypass has  previously occluded.  He presented with right lower extremity claudication  symptoms.  Evaluation revealed absent pulses in the right lower extremity,  suspected occlusion of the right limb of his aortobiiliac graft.  He is  brought to the cath lab at this time for diagnostic arteriography.   PROCEDURE NOTE:  The patient was brought to the cath lab in stable  condition, placed in supine position.   The left leg was prepped in sterile fashion.  He was administered 25 mcg of  fentanyl and 2 mg of Versed intravenously.   Skin and subcutaneous tissue in left groin were instilled with 1% Xylocaine.  The needle was easily induced in the left common femoral artery.  A 0.035 K  wire was passed through the needle.  The guide wire then passed up into the  abdominal aortic graft, the needle removed and a 5 French sheath advanced  over the guidewire.  Standard pigtail catheter was advanced over the  guidewire to the mid abdominal aorta.   AP abdominal aortogram was obtained.  This revealed a single perfused kidney  on the right.  No perfusion of the left kidney.   Occlusion of the left  aortorenal bypass.  The aortograft revealed moderate tortuosity in the  pelvis.  The right limb of the aortobiiliac graft was occluded.  The left  limb of the aortobiiliac graft was widely patent with an anastomosis at the  level of the left iliac bifurcation and brisk flow into the left hypogastric  and external iliac arteries.  There was free constitution via collaterals of  the right hypogastric and external iliac arteries.   Magnified view of the right renal artery was obtained.  This revealed widely  patent right main renal artery.  There was a superior pole accessory right  renal artery with a high-grade stenosis present.   Attention was then placed on the runoff.  The pigtail was brought down into  the body of the aortograft.  Lower extremity runoff arteriography obtained.  The left external iliac artery was widely patent at the common femoral  level.  There was mild aneurysmal dilatation of left common femoral artery.  The left lower extremity revealed intact profunda  and superficial femoral  arteries which were widely patent.  The left popliteal artery was widely  patent.  Intact three-vessel runoff in the calf of the left leg.   The right leg revealed reconstitution of the hypogastric and external iliac  arteries via collaterals.  Flow into a patent right common femoral artery.  The right profunda and superficial femoral arteries were both intact.  The  right popliteal artery was normal.  Intact three-vessel tibial artery runoff  in the right calf.   This completed the arteriogram procedure.  The guide wire was reinserted and  the catheter removed.   The left femoral sheath was then removed.  There were no apparent  complications.   FINAL IMPRESSION:  1.  Chronic occlusion, left aortorenal bypass graft with nonfunctioning left      kidney.  2.  Widely patent right main renal artery.  3.  High-grade stenosis of superior right accessory renal  artery.  4.  Occluded right limb of the aortobiiliac graft.  5.  Intact infrainguinal runoff bilaterally.  6.  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   DISPOSITION:  The results have been reviewed with the patient and family.  The patient will be scheduled for elective left to right femoral/femoral  bypass graft.       PGH/MEDQ  D:  12/27/2004  T:  12/27/2004  Job:  [id number]   cc:   [facility]   [employer]. Wall, M.D.

## 2011-05-02 NOTE — Discharge Summary (Signed)
NAMEPLATON, AROCHO NO.:  [id number]   MEDICAL RECORD NO.:  [id number]          PATIENT TYPE:  INP   LOCATION:  2029                         FACILITY:  [facility]   PHYSICIAN:  Balinda Quails, M.D.    DATE OF BIRTH:  04-15-1930   DATE OF ADMISSION:  01/14/2005  DATE OF DISCHARGE:  01/17/2005                                 DISCHARGE SUMMARY   HISTORY OF THE PRESENT ILLNESS:  The patient is a 77 year old Caucasian male  with peripheral vascular disease.  He is status post abdominal aortic  aneurysm repair with bilateral common iliac aneurysmal repair with an  aortobi-iliac bypass graft as well as left aortorenal bypass in 1996. The  left aortorenal bypass subsequently occluded and the patient now has an  atrophic left kidney.   The patient began complaining of right lower extremity claudication, which  has been on for little more than a year including right hip pain with  ambulation.  The patient denies rest pain.  ABIs were performed November 25, 2004 revealing 0.58 on the right and greater than 1 on the left.  There were  monophasic wave forms throughout the right lower extremity.  He underwent an  abdominal and pelvic CT scan, which revealed the aneurysmal repairs were  intact and that there was an occlusion of the proximal right common iliac  artery graft with reconstitutional distally.  An arteriogram was then  performed on December 27, 2004, which also revealed an occlusion of the right  limb of the aortobi-iliac bypass graft.  The patient does complain of hip  pain with ambulation as well as some low-back pain.   The patient was scheduled for surgery after evaluation by Dr. Madilyn Fireman.   PAST MEDICAL HISTORY:  1.  Peripheral vascular disease.  2.  Coronary artery disease.  3.  Hyperlipidemia.  4.  Noninsulin-dependent diabetes mellitus.  5.  Hypertension.  6.  Atrophic left kidney.  7.  Ventral hernia.  8.  Basal cell carcinoma.   PAST SURGICAL HISTORY:  1.  Status post aortobi-iliac bypass graft with aortorenal bypass in 1996.  2.  Coronary artery bypass grafting by Dr. Tyrone Sage in 1996.  3.  Basal cell carcinoma removed times two in 2000.  4.  Tonsillectomy.   MEDICATIONS:  Medications prior to admission:  1.  Metformin 500 mg t.i.d.  2.  Nifedipine 30 mg daily.  3.  Omeprazole 20 mg daily.  4.  Simvastatin 80 mg q.h.s.  5.  Enalapril 20 mg daily.  6.  Glyburide 10 mg with breakfast.  7.  Hydrochlorothiazide 25 mg 1/2 tablet daily.  8.  Zetia 10 mg daily.   ALLERGIES:  No known drug allergies.   FAMILY HISTORY:  Please see the history and physical done at the time of  admission.   SOCIAL HISTORY:  Please see the history and physical done at the time of  admission.   REVIEW OF SYSTEMS:  Please see the history and physical done at the time of  admission.   PHYSICAL EXAMINATION:  Please see the history and physical done  at the time  of admission.   HOSPITAL COURSE:  The patient was admitted and on January 14, 2005 was taken  to the operating room where he underwent resection of the left common  femoral artery aneurysm and a left-to-right crossed femoral bypass with a  [place]em[place] graft by Dr. Madilyn Fireman.  The patient tolerated the procedure well and  was taken to the post-anesthesia care unit stable.   Postoperative Hospital Course:  The patient did well.  The patient did have  some hyponatremia earlier postoperatively, but intravenous fluids were  adjusted.  His hemodynamic status was stable.  He did have an episode of  fever, but this resolved without significant findings.  Oxygen was weaned  and he maintained good saturations on room air. The incisions were healing  well without evidence of infection.  Peripheral vascular status showed the  extremities to be warm and well perfused.  Capillary blood glucoses ere  showed to be in adequate control.  The patient's overall status was deemed  to be acceptable for discharge on  January 17, 2005.   DISCHARGE MEDICATIONS:  The patient is to resume his home medications in  addition to something for pain, Tylox 1-2 q.4.6 hours p.r.n. as needed.   DISCHARGE INSTRUCTIONS:  The patient received written instructions regarding  medications, activities, diet, wound care, and follow up.   FOLLOW UP:  The patient will follow up with Dr. Madilyn Fireman February 03, 2005 at  8:30 A.M. in the CVTS office.   DISCHARGE DIAGNOSES:  1.  Occluded right limb of the aortobi-iliac bypass graft now status post      left-to-right femorofemoral bypass by Dr. Madilyn Fireman.  2.  Other diagnoses are as previously listed per the history.      WEG/MEDQ  D:  04/15/2005  T:  04/15/2005  Job:  [id number]   cc:   Jesse Sans. Wall, M.D.

## 2011-05-06 ENCOUNTER — Encounter: Payer: Self-pay | Admitting: Cardiology

## 2011-05-09 ENCOUNTER — Encounter: Payer: Self-pay | Admitting: Cardiology

## 2011-05-09 ENCOUNTER — Ambulatory Visit (INDEPENDENT_AMBULATORY_CARE_PROVIDER_SITE_OTHER): Payer: Medicare Other | Admitting: Cardiology

## 2011-05-09 DIAGNOSIS — I6529 Occlusion and stenosis of unspecified carotid artery: Secondary | ICD-10-CM

## 2011-05-09 DIAGNOSIS — I701 Atherosclerosis of renal artery: Secondary | ICD-10-CM

## 2011-05-09 DIAGNOSIS — I2510 Atherosclerotic heart disease of native coronary artery without angina pectoris: Secondary | ICD-10-CM

## 2011-05-09 MED ORDER — ASPIRIN EC 81 MG PO TBEC: 81.0000 mg | DELAYED_RELEASE_TABLET | Freq: Every day | ORAL | Status: AC

## 2011-05-09 MED ORDER — NITROGLYCERIN 0.4 MG SL SUBL: 0.4000 mg | SUBLINGUAL_TABLET | SUBLINGUAL | Status: AC | PRN

## 2011-05-09 NOTE — Patient Instructions (Addendum)
Your physician has recommended you make the following change in your medication: decrease aspirin to 81mg  daily Your physician recommends that you schedule a follow-up appointment in: 1 year with Dr. Daleen Squibb

## 2011-05-09 NOTE — Progress Notes (Signed)
   Patient ID: [name], male    DOB: November 05, 1930, 77 y.o.   MRN: [id number]  HPI  Mr Martelle returns for Sandre Kitty of his CAD, CVD, and PVD. He looks and feels good. He has met a nice lady friend and seems happy. He denies any symptoms of TIA's, angina or claudication, He complains of easy bruisability.  Dr Susann Givens changed him to Crestor and he says his numbers have been good. His BP has been stable and is good today.   Last carotids were 12/11 and were stable. His abdominal US in 11/10 showed nonobstuctive disease in his right renal, old occluded left renal. ABI's at the same time were also stable.   He recently had free blood work and will bring results to [place].    Review of Systems  All other systems reviewed and are negative.      Physical Exam  Nursing note and vitals reviewed. Constitutional: He is oriented to person, place, and time. He appears well-developed and well-nourished.  HENT:  Head: Normocephalic and atraumatic.  Eyes: EOM are normal. Pupils are equal, round, and reactive to light.  Neck: Neck supple. No JVD present. No tracheal deviation present. No thyromegaly present.       Bilateral bruits.  Cardiovascular: Normal rate and regular rhythm.   No extrasystoles are present. PMI is not displaced.  Exam reveals distant heart sounds.   Murmur heard.  Systolic murmur is present with a grade of 1/6  Pulses:      Carotid pulses are on the right side with bruit, and on the left side with bruit.      Dorsalis pedis pulses are 1+ on the right side, and 1+ on the left side.       Posterior tibial pulses are 1+ on the right side, and 1+ on the left side.  Pulmonary/Chest: Effort normal and breath sounds normal.  Abdominal: Soft. Bowel sounds are normal.  Musculoskeletal: Normal range of motion. He exhibits no edema.  Neurological: He is alert and oriented to person, place, and time.  Skin: Skin is warm and dry.  Psychiatric: He has a normal mood and affect.

## 2011-05-09 NOTE — Assessment & Plan Note (Signed)
BP is good. No change in treatment.

## 2011-05-09 NOTE — Assessment & Plan Note (Signed)
Stable, no change in treatment.

## 2011-05-09 NOTE — Assessment & Plan Note (Signed)
Repeat studies next month. Decrease ASA to 81 mg.

## 2011-05-14 ENCOUNTER — Encounter: Payer: Self-pay | Admitting: Cardiology

## 2011-05-14 ENCOUNTER — Encounter: Payer: Medicare Other | Admitting: *Deleted

## 2011-05-15 ENCOUNTER — Other Ambulatory Visit: Payer: Self-pay | Admitting: Family Medicine

## 2011-06-11 ENCOUNTER — Other Ambulatory Visit: Payer: Self-pay | Admitting: Family Medicine

## 2011-06-12 ENCOUNTER — Other Ambulatory Visit: Payer: Self-pay | Admitting: Cardiology

## 2011-06-12 DIAGNOSIS — I6529 Occlusion and stenosis of unspecified carotid artery: Secondary | ICD-10-CM

## 2011-06-13 ENCOUNTER — Encounter (INDEPENDENT_AMBULATORY_CARE_PROVIDER_SITE_OTHER): Payer: Medicare Other | Admitting: *Deleted

## 2011-06-13 DIAGNOSIS — I6529 Occlusion and stenosis of unspecified carotid artery: Secondary | ICD-10-CM

## 2011-06-17 ENCOUNTER — Encounter: Payer: Self-pay | Admitting: Family Medicine

## 2011-06-17 ENCOUNTER — Ambulatory Visit (INDEPENDENT_AMBULATORY_CARE_PROVIDER_SITE_OTHER): Payer: Medicare Other | Admitting: Family Medicine

## 2011-06-17 VITALS — BP 114/70 | HR 80 | Wt 221.0 lb

## 2011-06-17 DIAGNOSIS — E1142 Type 2 diabetes mellitus with diabetic polyneuropathy: Secondary | ICD-10-CM

## 2011-06-17 DIAGNOSIS — E785 Hyperlipidemia, unspecified: Secondary | ICD-10-CM

## 2011-06-17 DIAGNOSIS — E0840 Diabetes mellitus due to underlying condition with diabetic neuropathy, unspecified: Secondary | ICD-10-CM | POA: Insufficient documentation

## 2011-06-17 DIAGNOSIS — E1169 Type 2 diabetes mellitus with other specified complication: Secondary | ICD-10-CM

## 2011-06-17 DIAGNOSIS — I152 Hypertension secondary to endocrine disorders: Secondary | ICD-10-CM | POA: Insufficient documentation

## 2011-06-17 DIAGNOSIS — E1149 Type 2 diabetes mellitus with other diabetic neurological complication: Secondary | ICD-10-CM

## 2011-06-17 DIAGNOSIS — E1349 Other specified diabetes mellitus with other diabetic neurological complication: Secondary | ICD-10-CM

## 2011-06-17 DIAGNOSIS — I2510 Atherosclerotic heart disease of native coronary artery without angina pectoris: Secondary | ICD-10-CM

## 2011-06-17 DIAGNOSIS — E1159 Type 2 diabetes mellitus with other circulatory complications: Secondary | ICD-10-CM | POA: Insufficient documentation

## 2011-06-17 DIAGNOSIS — I10 Essential (primary) hypertension: Secondary | ICD-10-CM

## 2011-06-17 NOTE — Patient Instructions (Signed)
Continue to take good care of supple work a little bit harder on did not blood sugars down. Check here in about 4 months

## 2011-06-17 NOTE — Progress Notes (Signed)
  Subjective:    Patient ID: [name], male    DOB: 1930-10-14, 77 y.o.   MRN: [id number]  HPI He is here for a diabetes recheck. He states his blood sugars run below 135. He did have blood work done at the [place] in mid June. It was reviewed and does show hemoglobin A1c of 7.4. He admits to dietary deviation. Checks his feet fairly regularly. He has an eye exam scheduled in December. Last LDL was 99. Life in general is going quite well for him. He is living with someone in May or doing a lot of camping and enjoying it. No chest pain, shortness of breath,PND.   Review of Systems     Objective:   Physical Exam alert and in no distress otherwise not examined        Assessment & Plan:  Diabetes. Hypertension. Dyslipidemia. Continue present medication regimen.

## 2011-06-21 ENCOUNTER — Other Ambulatory Visit: Payer: Self-pay | Admitting: Family Medicine

## 2011-06-27 ENCOUNTER — Telehealth: Payer: Self-pay | Admitting: *Deleted

## 2011-06-27 NOTE — Telephone Encounter (Signed)
LMTCB. Carotids were stable per Dr. Daleen Squibb  Follow-up carotids in 1 year. Mylo Red RN

## 2011-06-27 NOTE — Telephone Encounter (Signed)
LMTCB Debbie Chalonda Schlatter RN  

## 2011-07-08 NOTE — Telephone Encounter (Signed)
Pt aware of carotid results Debbie Jehiel Koepp RN  

## 2011-07-28 ENCOUNTER — Ambulatory Visit (INDEPENDENT_AMBULATORY_CARE_PROVIDER_SITE_OTHER): Payer: Medicare Other | Admitting: Family Medicine

## 2011-07-28 ENCOUNTER — Encounter: Payer: Self-pay | Admitting: Family Medicine

## 2011-07-28 DIAGNOSIS — I152 Hypertension secondary to endocrine disorders: Secondary | ICD-10-CM

## 2011-07-28 DIAGNOSIS — E119 Type 2 diabetes mellitus without complications: Secondary | ICD-10-CM

## 2011-07-28 DIAGNOSIS — E1159 Type 2 diabetes mellitus with other circulatory complications: Secondary | ICD-10-CM

## 2011-07-28 DIAGNOSIS — E1169 Type 2 diabetes mellitus with other specified complication: Secondary | ICD-10-CM

## 2011-07-28 DIAGNOSIS — I10 Essential (primary) hypertension: Secondary | ICD-10-CM

## 2011-07-28 DIAGNOSIS — E785 Hyperlipidemia, unspecified: Secondary | ICD-10-CM

## 2011-07-28 DIAGNOSIS — E291 Testicular hypofunction: Secondary | ICD-10-CM

## 2011-07-28 MED ORDER — ROSUVASTATIN CALCIUM 10 MG PO TABS: 10.0000 mg | ORAL_TABLET | Freq: Every day | ORAL | Status: DC

## 2011-07-28 NOTE — Patient Instructions (Signed)
Work a little bit harder to get her morning blood sugars under 120. Stay on all your other medications and start using the testosterone. You back here in 4 months

## 2011-07-28 NOTE — Progress Notes (Signed)
  Subjective:    Patient ID: [name], male    DOB: June 04, 1930, 77 y.o.   MRN: [id number]  HPI He is here for recheck. He continues on medications listed in the chart. He did stop taking his testosterone but did not give me a good reason why. He does get regular checkups at the [place]. Recent blood work including hemoglobin A1c was reviewed. His number was 7.5. He remains quite active. He has been traveling with his male companion. He this seems to be going quite well for him. He's had no chest pain, shortness of breath or dyspnea on exertion.   Review of Systems Negative except as above    Objective:   Physical Exam Alert and in no distress. Hemoglobin A1c is 7.5.       Assessment & Plan:  Diabetes. Hypertension. Hyperlipidemia. Hypogonadism. I will increase his Crestor to 10 mg. I will also [facility]. Continue on his other medications. Recheck here in 4 months.

## 2011-08-13 IMAGING — CR DG LUMBAR SPINE COMPLETE 4+V
5 series · 5 of 5 positions shown · non-contrast
Comparison: 01/01/2006

CLINICAL DATA: Chronic low back pain.

LUMBAR SPINE - COMPLETE 4+ VIEW

[view not recorded (1 of 5)]
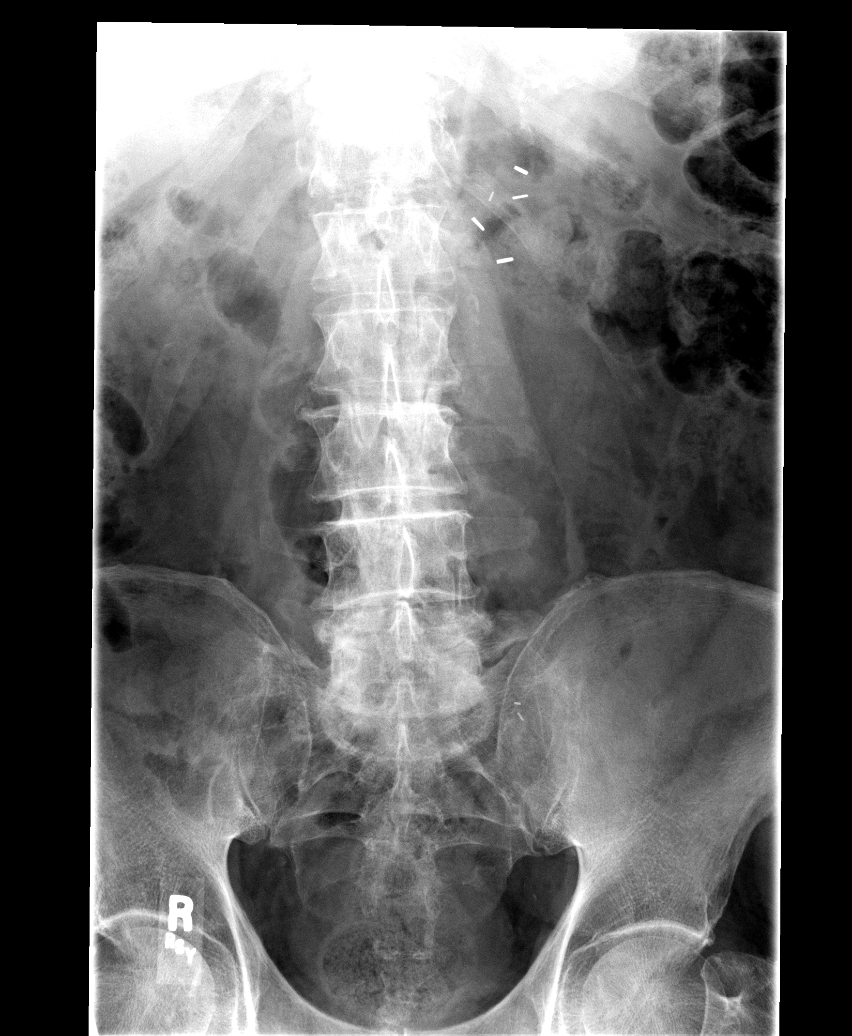

[view not recorded (2 of 5)]
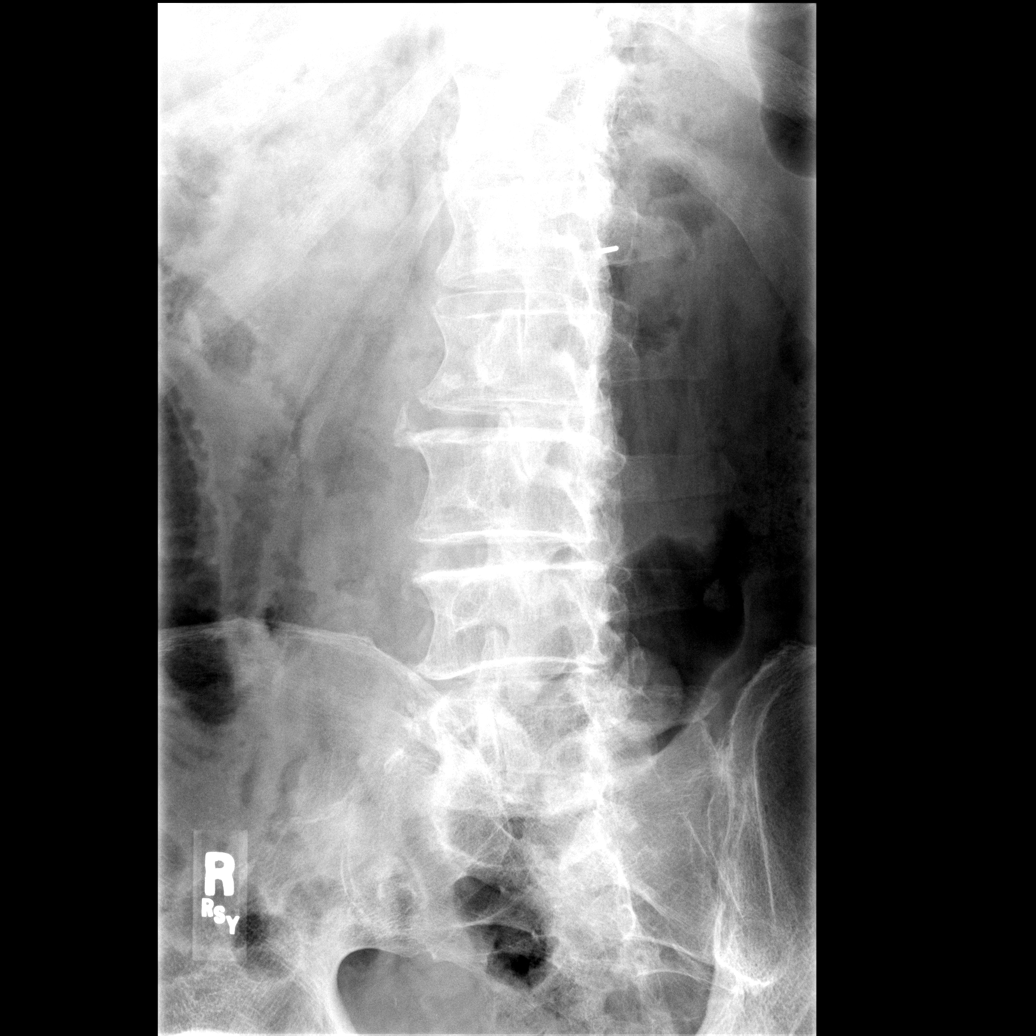

[view not recorded (3 of 5)]
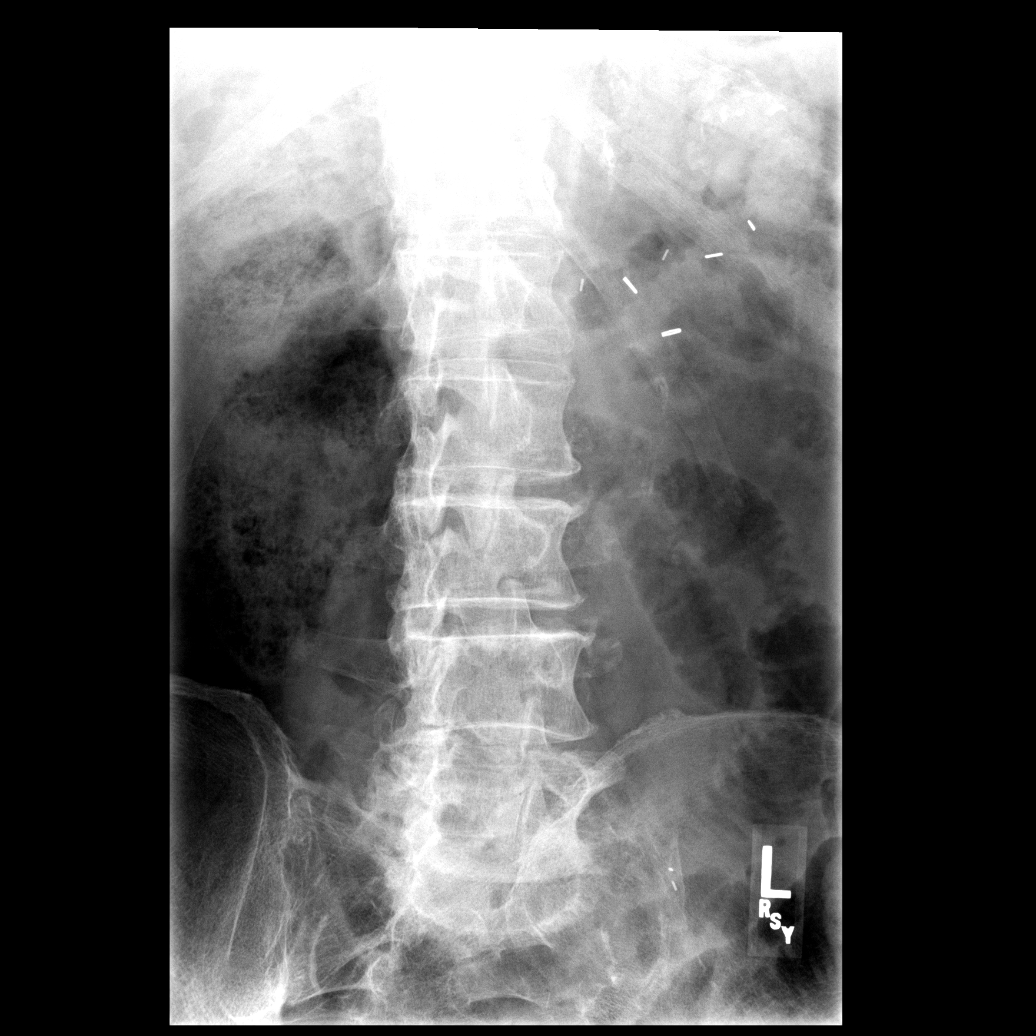

[view not recorded (4 of 5)]
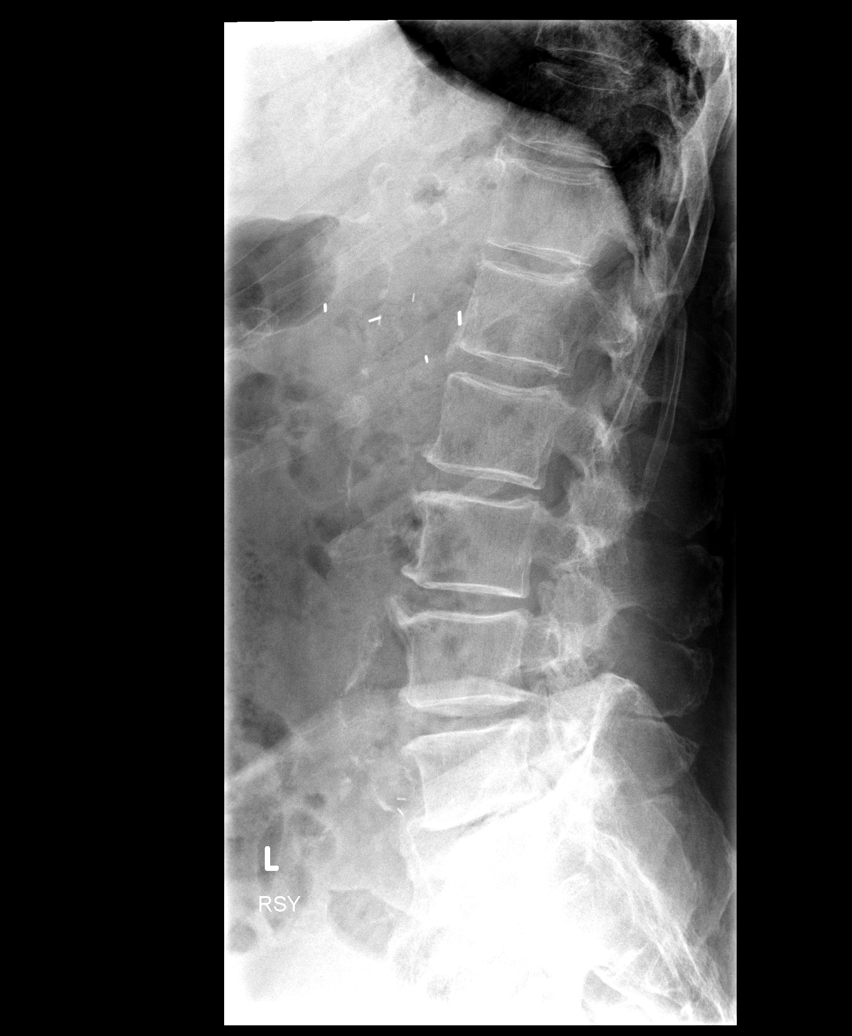

[view not recorded (5 of 5)]
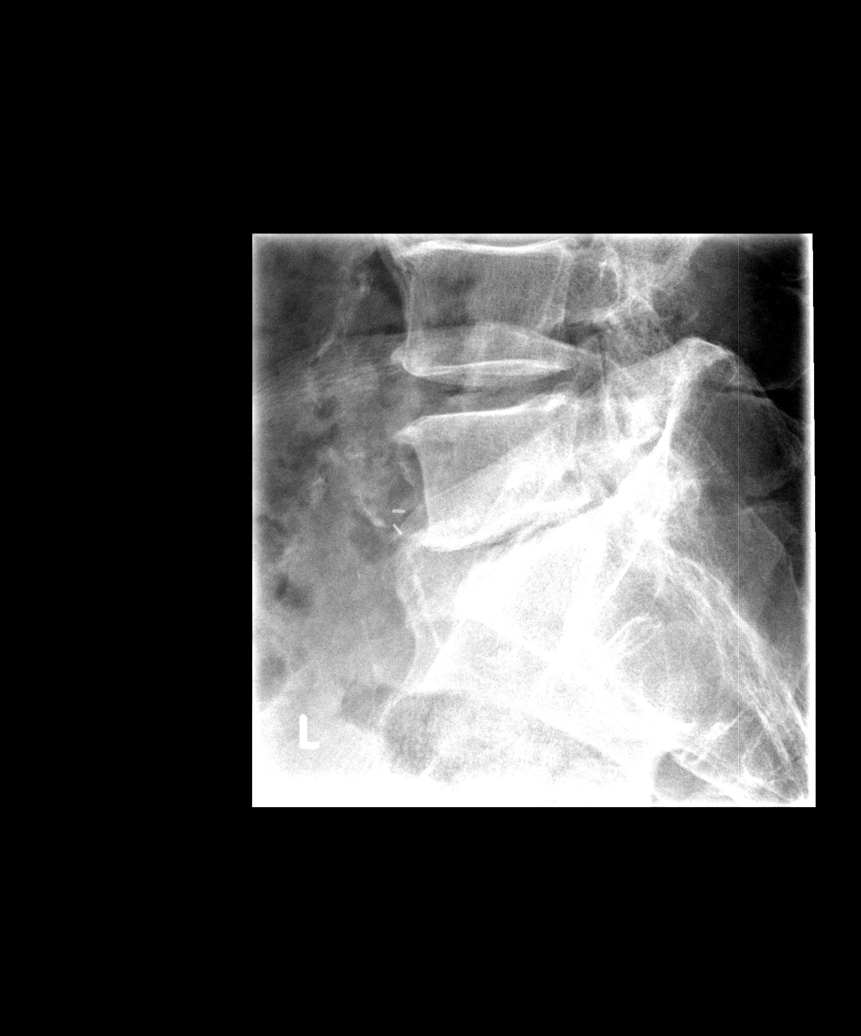

[5 of 5 positions shown; findings below may reference images not displayed]

FINDINGS: Alignment is anatomic.  Vertebral body height is
maintained.  Endplate degenerative changes, loss of disc space
height and facet hypertrophy are worst at L5-S1.  Atherosclerotic
calcification of the arterial vasculature.  Surgical clips in the
left paraspinal region.
IMPRESSION: Spondylosis, worst at L5-S1.

## 2011-08-18 ENCOUNTER — Other Ambulatory Visit: Payer: Self-pay | Admitting: Family Medicine

## 2011-09-10 ENCOUNTER — Telehealth: Payer: Self-pay | Admitting: Family Medicine

## 2011-09-10 NOTE — Telephone Encounter (Signed)
Called in lantus solastar 100 ml injection with 1 refill and 8 mm needles with 1 refills to [facility] per [facility].  CM, LPN

## 2011-09-10 NOTE — Telephone Encounter (Signed)
Call all of this in.

## 2011-09-24 ENCOUNTER — Other Ambulatory Visit: Payer: Self-pay | Admitting: Family Medicine

## 2011-10-10 ENCOUNTER — Encounter: Payer: Self-pay | Admitting: Family Medicine

## 2011-10-10 ENCOUNTER — Ambulatory Visit (INDEPENDENT_AMBULATORY_CARE_PROVIDER_SITE_OTHER): Payer: Medicare Other | Admitting: Family Medicine

## 2011-10-10 VITALS — BP 122/80 | HR 74 | Temp 97.8°F | Wt 218.0 lb

## 2011-10-10 DIAGNOSIS — H6120 Impacted cerumen, unspecified ear: Secondary | ICD-10-CM

## 2011-10-10 DIAGNOSIS — J209 Acute bronchitis, unspecified: Secondary | ICD-10-CM

## 2011-10-10 MED ORDER — DOXYCYCLINE HYCLATE 100 MG PO TABS: 100.0000 mg | ORAL_TABLET | Freq: Two times a day (BID) | ORAL | Status: AC

## 2011-10-10 NOTE — Patient Instructions (Signed)
Take all the antibiotic and if not totally back to normal give me a call 

## 2011-10-10 NOTE — Progress Notes (Signed)
  Subjective:    Patient ID: [name], male    DOB: 1930-06-21, 77 y.o.   MRN: [id number]  HPI He has a one-week history of sore throat and chest congestion with some diarrhea. Apparently the diarrhea has been intermittent prior to this. Presently he is still having sore throat, chest congestion and slightly productive cough with malaise and fatigue. He has been exposed to younger children with various viral illnesses over the last several weeks. He also is having difficulty hearing from the left ear. He does not smoke. Has no allergies .   Review of Systems     Objective:   Physical Exam alert and in no distress. Tympanic membranes and canals are normal after cerumen was removed from the left canal. Throat is clear. Tonsils are normal. Neck is supple without adenopathy or thyromegaly. Cardiac exam shows a regular sinus rhythm without murmurs or gallops. Lungs are clear to auscultation.        Assessment & Plan:   1. Bronchitis, acute   2. Cerumen impaction    I will place him on doxycycline. Encouraged him to call me if not entirely better

## 2011-10-21 ENCOUNTER — Ambulatory Visit: Payer: Medicare Other | Admitting: Family Medicine

## 2011-11-12 ENCOUNTER — Other Ambulatory Visit: Payer: Self-pay | Admitting: Family Medicine

## 2011-11-27 ENCOUNTER — Encounter: Payer: Self-pay | Admitting: Family Medicine

## 2011-11-27 ENCOUNTER — Ambulatory Visit (INDEPENDENT_AMBULATORY_CARE_PROVIDER_SITE_OTHER): Payer: Medicare Other | Admitting: Family Medicine

## 2011-11-27 VITALS — BP 142/80 | HR 88 | Wt 220.0 lb

## 2011-11-27 DIAGNOSIS — E119 Type 2 diabetes mellitus without complications: Secondary | ICD-10-CM

## 2011-11-27 DIAGNOSIS — Z23 Encounter for immunization: Secondary | ICD-10-CM

## 2011-11-27 DIAGNOSIS — I10 Essential (primary) hypertension: Secondary | ICD-10-CM

## 2011-11-27 DIAGNOSIS — E669 Obesity, unspecified: Secondary | ICD-10-CM | POA: Insufficient documentation

## 2011-11-27 DIAGNOSIS — E785 Hyperlipidemia, unspecified: Secondary | ICD-10-CM

## 2011-11-27 DIAGNOSIS — I2510 Atherosclerotic heart disease of native coronary artery without angina pectoris: Secondary | ICD-10-CM

## 2011-11-27 NOTE — Progress Notes (Signed)
  Subjective:    Patient ID: [name], male    DOB: [birth year], 77 y.o.   MRN: [id number]  HPI He is here for recheck. He continues on medications listed in the chart. He's had no difficulty recently with chest pain, shortness of breath,DOE. He does check his feet periodically and recently saw his ophthalmologist. He and his girlfriend are getting along quite nicely. He has no other concerns or complaints.  Review of Systems     Objective:   Physical Exam Alert and in no distress. Hemoglobin A1c is 6.7.       Assessment & Plan:   1. Type II or unspecified type diabetes mellitus without mention of complication, not stated as uncontrolled  POCT HgB A1C  2. ASHD (arteriosclerotic heart disease)    3. Hyperlipidemia LDL goal <70    4. HYPERTENSION, UNSPECIFIED    5. Obesity (BMI 30-39.9)     continue with present lifestyle and medications. Recheck here in 4 months.

## 2011-12-01 ENCOUNTER — Other Ambulatory Visit: Payer: Self-pay | Admitting: Family Medicine

## 2011-12-25 ENCOUNTER — Other Ambulatory Visit: Payer: Self-pay | Admitting: Cardiology

## 2011-12-25 ENCOUNTER — Encounter (INDEPENDENT_AMBULATORY_CARE_PROVIDER_SITE_OTHER): Payer: Medicare Other | Admitting: Cardiology

## 2011-12-25 DIAGNOSIS — I6529 Occlusion and stenosis of unspecified carotid artery: Secondary | ICD-10-CM

## 2012-03-17 ENCOUNTER — Other Ambulatory Visit: Payer: Self-pay | Admitting: Family Medicine

## 2012-04-01 ENCOUNTER — Encounter: Payer: Self-pay | Admitting: Family Medicine

## 2012-04-01 ENCOUNTER — Ambulatory Visit (INDEPENDENT_AMBULATORY_CARE_PROVIDER_SITE_OTHER): Payer: Medicare Other | Admitting: Family Medicine

## 2012-04-01 DIAGNOSIS — Z9119 Patient's noncompliance with other medical treatment and regimen: Secondary | ICD-10-CM

## 2012-04-01 DIAGNOSIS — Z91199 Patient's noncompliance with other medical treatment and regimen due to unspecified reason: Secondary | ICD-10-CM

## 2012-04-01 DIAGNOSIS — I10 Essential (primary) hypertension: Secondary | ICD-10-CM

## 2012-04-01 DIAGNOSIS — E119 Type 2 diabetes mellitus without complications: Secondary | ICD-10-CM

## 2012-04-01 DIAGNOSIS — Z794 Long term (current) use of insulin: Secondary | ICD-10-CM

## 2012-04-01 DIAGNOSIS — Z79899 Other long term (current) drug therapy: Secondary | ICD-10-CM

## 2012-04-01 DIAGNOSIS — E785 Hyperlipidemia, unspecified: Secondary | ICD-10-CM

## 2012-04-01 DIAGNOSIS — I2510 Atherosclerotic heart disease of native coronary artery without angina pectoris: Secondary | ICD-10-CM

## 2012-04-01 LAB — POCT UA - MICROALBUMIN: Microalbumin Ur, POC: 87.2 mg/dL

## 2012-04-01 NOTE — Patient Instructions (Addendum)
I want you to get physically activity ,starting with 5 minutes twice a day then after a week increase it to 6 minutes twice a day. Keep doing this until you're walking a total of a half an hour a day. If you get short of breath or have chest pain with walking, give [place] a call

## 2012-04-01 NOTE — Progress Notes (Signed)
  Subjective:    Patient ID: [name], male    DOB: January 10, 1930, 78 y.o.   MRN: [id number]  HPI He is here for an interval evaluation. He does go to the [place] regularly to get his medicines and for evaluations. He was seen there recently and did bring blood work. He would like me to fill out a handicapped sticker. He relates that he has difficulty with walking but then states he can walk around [employer] for long periods of time without difficulty. He does not exercise regularly. He does checks his blood sugars daily. He had a foot exam done at the [place] on his last visit. He is up-to-date on his ophthalmology visit .He is scheduled to see his cardiologist in May. He does complain of some difficulty with fatigue and intermittent chest pain but not necessarily related to physical activities. He also states that his legs get numb at night but the numbness goes away when he gets up and moves around. He does have an underlying history of PVD.   Review of Systems     Objective:   Physical Exam Alert and in no distress. Cardiac exam shows regular rhythm without murmurs or gallops. Lungs are clear to auscultation.       Assessment & Plan:   1. Diabetes mellitus  POCT HgB A1C, POCT UA - Microalbumin  2. HYPERLIPIDEMIA-MIXED    3. HYPERTENSION, UNSPECIFIED    4. ATHEROSCLEROTIC CARDIOVASCULAR DISEASE    5. Encounter for long-term (current) use of insulin    6. Encounter for long-term (current) use of other medications    7. Personal history of noncompliance with medical treatment, presenting hazards to health     I encouraged him to start a walking program very very slowly. He does complain of intermittent chest pain although it is difficult to get a good history as to whether this is truly cardiac. I did not fill out the handicap placard as I do want him to become more physically active and I do not feel that he is qualified.

## 2012-04-29 ENCOUNTER — Telehealth: Payer: Self-pay | Admitting: Family Medicine

## 2012-04-29 NOTE — Telephone Encounter (Signed)
LM

## 2012-05-21 ENCOUNTER — Other Ambulatory Visit: Payer: Self-pay | Admitting: Family Medicine

## 2012-06-14 ENCOUNTER — Ambulatory Visit (INDEPENDENT_AMBULATORY_CARE_PROVIDER_SITE_OTHER): Payer: Medicare Other | Admitting: Family Medicine

## 2012-06-14 VITALS — BP 120/60 | HR 80

## 2012-06-14 DIAGNOSIS — R0609 Other forms of dyspnea: Secondary | ICD-10-CM

## 2012-06-14 NOTE — Progress Notes (Signed)
  Subjective:    Patient ID: [name], male    DOB: November 02, 1930, 78 y.o.   MRN: [id number]  HPI He complains of a several month history of feeling short of breath however it is intermittent in nature. He also complains of dyspnea on exertion and some PND. Further discussion with him indicates that he was seen at the [facility] and a stress test done and subsequently had a catheterization done. He states that he was told that 3 of the 4 vessels were patent and he is supposed to followup with his physician at the [facility] however he has not done this yet. He would like to know what his options are. He also states that since he stopped the metformin prior to catheterization he was feeling better.  Review of Systems     Objective:   Physical Exam Alert and in no distress. Cardiac exam shows a sinus rhythm without murmurs or gallops. Lungs clear to auscultation.       Assessment & Plan:  DOB, PND. S/Pcatheterization. Encouraged him to call his physician at the [facility] and set up an appointment. Encouraged him to also tell about the metformin response.

## 2012-06-24 ENCOUNTER — Other Ambulatory Visit: Payer: Self-pay | Admitting: *Deleted

## 2012-06-24 DIAGNOSIS — I6529 Occlusion and stenosis of unspecified carotid artery: Secondary | ICD-10-CM

## 2012-06-29 ENCOUNTER — Encounter (INDEPENDENT_AMBULATORY_CARE_PROVIDER_SITE_OTHER): Payer: Medicare Other

## 2012-06-29 DIAGNOSIS — I6529 Occlusion and stenosis of unspecified carotid artery: Secondary | ICD-10-CM

## 2012-10-01 ENCOUNTER — Ambulatory Visit: Payer: Medicare Other | Admitting: Family Medicine

## 2012-11-22 ENCOUNTER — Other Ambulatory Visit: Payer: Self-pay

## 2012-11-22 ENCOUNTER — Telehealth: Payer: Self-pay | Admitting: Cardiology

## 2012-11-22 DIAGNOSIS — I6529 Occlusion and stenosis of unspecified carotid artery: Secondary | ICD-10-CM

## 2012-11-22 NOTE — Telephone Encounter (Signed)
Patient called after reviewing patient's chart his last carotid dopplers 06/30/12 and Dr.Wall advised to repeat in 1 year.Patient was told did not have to have done until July 2014.

## 2012-11-22 NOTE — Telephone Encounter (Signed)
plz return call to pt [phone] regarding order for Carotid exam.

## 2012-11-22 NOTE — Telephone Encounter (Signed)
Patient called no answer.LMTC. 

## 2012-11-22 NOTE — Telephone Encounter (Signed)
Patient called he stated he is due to have repeat carotid dopplers in January.States he is leaving to go to [facility] 12/07/12 and would like to have done before he leaves.Schedulers will call to schedule.

## 2012-12-07 ENCOUNTER — Encounter: Payer: Self-pay | Admitting: Internal Medicine

## 2012-12-16 ENCOUNTER — Telehealth: Payer: Self-pay | Admitting: Family Medicine

## 2012-12-16 MED ORDER — INSULIN GLARGINE 100 UNIT/ML ~~LOC~~ SOLN: 44.0000 [IU] | Freq: Every day | SUBCUTANEOUS | Status: DC

## 2012-12-16 NOTE — Telephone Encounter (Signed)
Go ahead and get him the Lantus

## 2012-12-16 NOTE — Telephone Encounter (Signed)
SENT IN MEDS HAD TO CALL PT TO FIND OUT WHAT CITY HE WAS INFORMED THAT MED SENT IN

## 2013-02-08 ENCOUNTER — Ambulatory Visit (INDEPENDENT_AMBULATORY_CARE_PROVIDER_SITE_OTHER): Payer: Medicare Other | Admitting: Family Medicine

## 2013-02-08 ENCOUNTER — Encounter: Payer: Self-pay | Admitting: Family Medicine

## 2013-02-08 VITALS — BP 116/70 | HR 70 | Ht 69.5 in | Wt 229.0 lb

## 2013-02-08 DIAGNOSIS — E1159 Type 2 diabetes mellitus with other circulatory complications: Secondary | ICD-10-CM

## 2013-02-08 DIAGNOSIS — E669 Obesity, unspecified: Secondary | ICD-10-CM

## 2013-02-08 DIAGNOSIS — E1142 Type 2 diabetes mellitus with diabetic polyneuropathy: Secondary | ICD-10-CM

## 2013-02-08 DIAGNOSIS — E1169 Type 2 diabetes mellitus with other specified complication: Secondary | ICD-10-CM

## 2013-02-08 DIAGNOSIS — E0840 Diabetes mellitus due to underlying condition with diabetic neuropathy, unspecified: Secondary | ICD-10-CM

## 2013-02-08 DIAGNOSIS — E1349 Other specified diabetes mellitus with other diabetic neurological complication: Secondary | ICD-10-CM

## 2013-02-08 DIAGNOSIS — I10 Essential (primary) hypertension: Secondary | ICD-10-CM

## 2013-02-08 DIAGNOSIS — I2510 Atherosclerotic heart disease of native coronary artery without angina pectoris: Secondary | ICD-10-CM

## 2013-02-08 DIAGNOSIS — I739 Peripheral vascular disease, unspecified: Secondary | ICD-10-CM

## 2013-02-08 NOTE — Progress Notes (Signed)
  Subjective:    Patient ID: [name], male    DOB: 1930/05/20, 79 y.o.   MRN: [id number]  HPI He is here for an interval evaluation. He gets most of his care through the [place]. He was seen over there recently and did have cardiac testing done. Presently he is taking nitroglycerin usually once or twice per day for treatment of shortness of breath and what he describes as dizziness. He says the medicine does help. He also has noted a several month history of decreased stream, frequency and nocturia. He does usually check his blood sugars once per day. He continues on medications listed in the chart. Smoking and drinking were reviewed. He is involved in a relationship which is going quite well. He has no other concerns or complaints. Review of Systems     Objective:   Physical Exam Alert and in no distress. Cardiac exam shows regular rhythm without murmurs or gallops. Lungs clear to auscultation. Hemoglobin A1c is 7.4.       Assessment & Plan:  Diabetes mellitus due to underlying condition with diabetic neuropathy - Plan: POCT glycosylated hemoglobin (Hb A1C)  Hypertension associated with diabetes  ASHD (arteriosclerotic heart disease)  Obesity (BMI 30-39.9)  PVD him and he returned to the [place] to discuss being placed on a different cardiac meds. Also he is to discuss with them medications with prostate symptoms. Recommend he check his blood sugars 2 hours after meals and not just before. Reinforced the need for him to take better care of his diabetes since his A1c was up a little bit

## 2013-02-08 NOTE — Patient Instructions (Addendum)
When you go back to the [place] make sure you mention that you using the nitroglycerin at least twice per day and discuss using a longer acting medication.also talked to them about your prostate and mention decreased stream and going a whole bunch as well as urinating at night Make sure you get a shingles vaccine. Make sure you check your blood sugars 2 hours after a meal

## 2013-02-25 ENCOUNTER — Encounter: Payer: Self-pay | Admitting: Family Medicine

## 2013-03-02 ENCOUNTER — Encounter (INDEPENDENT_AMBULATORY_CARE_PROVIDER_SITE_OTHER): Payer: Medicare Other | Admitting: Ophthalmology

## 2013-03-02 DIAGNOSIS — I10 Essential (primary) hypertension: Secondary | ICD-10-CM

## 2013-03-02 DIAGNOSIS — H43819 Vitreous degeneration, unspecified eye: Secondary | ICD-10-CM

## 2013-03-02 DIAGNOSIS — H35039 Hypertensive retinopathy, unspecified eye: Secondary | ICD-10-CM

## 2013-03-02 DIAGNOSIS — H3530 Unspecified macular degeneration: Secondary | ICD-10-CM

## 2013-05-26 ENCOUNTER — Encounter (INDEPENDENT_AMBULATORY_CARE_PROVIDER_SITE_OTHER): Payer: Medicare Other

## 2013-05-26 DIAGNOSIS — I6529 Occlusion and stenosis of unspecified carotid artery: Secondary | ICD-10-CM

## 2013-07-04 ENCOUNTER — Ambulatory Visit (INDEPENDENT_AMBULATORY_CARE_PROVIDER_SITE_OTHER): Payer: Medicare Other | Admitting: Family Medicine

## 2013-07-04 ENCOUNTER — Encounter: Payer: Self-pay | Admitting: Family Medicine

## 2013-07-04 VITALS — BP 130/70 | HR 68 | Wt 229.0 lb

## 2013-07-04 DIAGNOSIS — M25551 Pain in right hip: Secondary | ICD-10-CM

## 2013-07-04 DIAGNOSIS — M25559 Pain in unspecified hip: Secondary | ICD-10-CM

## 2013-07-04 NOTE — Patient Instructions (Signed)
Try Tylenol first and then you can add Aleve or take it separately but take 2 Aleve twice per day

## 2013-07-04 NOTE — Progress Notes (Signed)
  Subjective:    Patient ID: [name], male    DOB: 27-Mar-1930, 79 y.o.   MRN: [id number]  HPI He complains of a one-year history of right hip pain that has gotten much worse in the last week. The pain is made worse with physical activity. No history of injury to it. No numbness, tingling or weakness. No other joints involved. He does have underlying diabetic neuropathy and does have some minor dysesthesias He has tried ibuprofen 600 mg without relief. Review of Systems     Objective:   Physical Exam Alert and in no distress. Slight decrease in hip flexion is noted. Internal/external rotation are noted without pain.       Assessment & Plan:  Right hip pain - Plan: DG Hip Complete Right  Also recommend that he try Tylenol at the end and then switch to Aleve.

## 2013-07-05 ENCOUNTER — Ambulatory Visit
Admission: RE | Admit: 2013-07-05 | Discharge: 2013-07-05 | Disposition: A | Discharge status: Home / Self Care | Payer: Medicare Other | Source: Ambulatory Visit | Attending: Family Medicine | Admitting: Family Medicine

## 2013-07-05 DIAGNOSIS — M25551 Pain in right hip: Secondary | ICD-10-CM

## 2013-07-05 NOTE — Progress Notes (Signed)
Quick Note:  LEFT WORD FOR WORD MESSAGE Let him know that he does have some mild degenerative changes. Recommend 2 Aleve twice per day for the next 10 days to see if we can calm this down and if continued difficulty have him set up an appointment for recheck ______

## 2013-07-18 ENCOUNTER — Ambulatory Visit (INDEPENDENT_AMBULATORY_CARE_PROVIDER_SITE_OTHER): Payer: Medicare Other | Admitting: Family Medicine

## 2013-07-18 ENCOUNTER — Encounter: Payer: Self-pay | Admitting: Family Medicine

## 2013-07-18 VITALS — BP 140/80 | HR 62 | Wt 232.0 lb

## 2013-07-18 DIAGNOSIS — M25559 Pain in unspecified hip: Secondary | ICD-10-CM

## 2013-07-18 DIAGNOSIS — M25551 Pain in right hip: Secondary | ICD-10-CM

## 2013-07-18 NOTE — Progress Notes (Signed)
  Subjective:    Patient ID: [name], male    DOB: [birth year], 79 y.o.   MRN: [id number]  HPI He is here for evaluation of continued difficulty with hip pain. X-rays do show degenerative changes. This is interfering with the quality of his life and he is interested in potentially getting an injection. He has tried OTC pain meds without relief.    Review of Systems     Objective:   Physical Exam Alert and in no distress       Assessment & Plan:  Right hip pain - Plan: Ambulatory referral to Orthopedic Surgery He will be referred to orthopedics for possible joint injection.

## 2013-11-07 ENCOUNTER — Ambulatory Visit (INDEPENDENT_AMBULATORY_CARE_PROVIDER_SITE_OTHER): Payer: Medicare Other | Admitting: Family Medicine

## 2013-11-07 ENCOUNTER — Encounter: Payer: Self-pay | Admitting: Family Medicine

## 2013-11-07 VITALS — BP 120/70 | HR 78 | Wt 229.0 lb

## 2013-11-07 DIAGNOSIS — I2510 Atherosclerotic heart disease of native coronary artery without angina pectoris: Secondary | ICD-10-CM

## 2013-11-07 DIAGNOSIS — J449 Chronic obstructive pulmonary disease, unspecified: Secondary | ICD-10-CM

## 2013-11-07 DIAGNOSIS — Z23 Encounter for immunization: Secondary | ICD-10-CM

## 2013-11-07 DIAGNOSIS — E119 Type 2 diabetes mellitus without complications: Secondary | ICD-10-CM

## 2013-11-07 DIAGNOSIS — E669 Obesity, unspecified: Secondary | ICD-10-CM

## 2013-11-07 NOTE — Progress Notes (Signed)
Teaching Physician: Sharlot Gowda, MD Dictated By: Judithann Graves  Subjective:  [name] is a 79 y.o. male who presents with concern regarding coughing that he has had for the last 3-4 months. It has always been a dry cough, rarely productive. He reports that his cough is most noticeable when he is taking a shower at night. He has occasional heartburn and reflux that is worse when he is supine. He has no SOB at baseline but he does have some DOE though he is unsure whether it is worse than it was at his last visit here. Over the last 6 months he has also had some SOB that is worse when lying down supine, relieved by propping pillows under his back. He describes some chest pain but this is always felt 15 minutes after swallowing his nightly medications. He has no dysphagia to liquids or solids.  He has no throat pain, congestion, or fever. He began taking his enalapril greater than 1 yr ago. He has an underlying heart disease history. He is supposed to followup with the cardiologist at the [facility] but he is unsure whether he that has occurred. He was diagnosed with COPD and presently is on Spiriva and albuterol. He also has diabetes but states his last hemoglobin A1c at the [place] was 7.5 ROS as in subjective.  Objective: Filed Vitals:   11/07/13 1339  BP: 120/70  Pulse: 78    Physical Exam:  General: Alert and in no distress  HEENT: Tympanic membranes and canals are normal. Throat is clear. Tonsils are normal.  Neck: Supple without adenopathy or thyromegaly.  CV: Regular sinus rhythm without murmurs or gallops. Carotid bruits bilaterally L > R. JVD < 5 cm. Pulm: Lungs are clear to auscultation with no crackles or egophony.  has a current medication list which includes the following prescription(s): albuterol, aspirin, atorvastatin, b-d ultrafine iii short pen, enalapril, insulin glargine, metformin, metoprolol, niaspan, nitroglycerin, and tiotropium.  Assessment and Plan: 1. DM Continue current  regimen with Metformin and nightly Insulin Glargine   2. ATHEROSCLEROTIC CARDIOVASCULAR DISEASE Managed by cardiology.   3. Obesity (BMI 30-39.9)  4. CAD, NATIVE VESSEL Managed by cardiology.  5. COPD (chronic obstructive pulmonary disease) Managed by physician at [facility].  6. Need for prophylactic vaccination and inoculation against influenza - Flu vaccine HIGH DOSE PF (Fluzone Tri High dose)  7. Chronic Cough, DOE: As his COPD, CAD, Atherosclerotic CVD are managed at the [place]. He is encouraged to follow-up with his team at the [place] next week for optimization of these conditions.  He has an appointment to be seen by his doctor at the [place]. Encouraged him to go there because he definitely needs followup on his COPD and underlying heart disease. I did not feel it necessary to start her workup here since most of his care is through the [place] and I do not feel that he is in any danger. Dr. Susann Givens was present for the encounter and agrees with the above assessment and plan.

## 2013-11-07 NOTE — Patient Instructions (Addendum)
See if you can get a copy of your last couple of office visits and send them to me. Keep your appointment at the [facility] and make sure you mentioned all the problems you're having with breathing

## 2013-12-02 LAB — HM DIABETES EYE EXAM

## 2014-01-05 ENCOUNTER — Encounter: Payer: Self-pay | Admitting: Family Medicine

## 2014-01-05 ENCOUNTER — Encounter: Payer: Self-pay | Admitting: Internal Medicine

## 2014-03-03 ENCOUNTER — Ambulatory Visit (INDEPENDENT_AMBULATORY_CARE_PROVIDER_SITE_OTHER): Payer: Medicare Other | Admitting: Ophthalmology

## 2014-03-15 ENCOUNTER — Ambulatory Visit (INDEPENDENT_AMBULATORY_CARE_PROVIDER_SITE_OTHER): Payer: Medicare Other | Admitting: Ophthalmology

## 2014-03-15 DIAGNOSIS — H3530 Unspecified macular degeneration: Secondary | ICD-10-CM

## 2014-03-15 DIAGNOSIS — H35039 Hypertensive retinopathy, unspecified eye: Secondary | ICD-10-CM

## 2014-03-15 DIAGNOSIS — E11319 Type 2 diabetes mellitus with unspecified diabetic retinopathy without macular edema: Secondary | ICD-10-CM

## 2014-03-15 DIAGNOSIS — I10 Essential (primary) hypertension: Secondary | ICD-10-CM

## 2014-03-15 DIAGNOSIS — H43819 Vitreous degeneration, unspecified eye: Secondary | ICD-10-CM

## 2014-03-15 DIAGNOSIS — E1139 Type 2 diabetes mellitus with other diabetic ophthalmic complication: Secondary | ICD-10-CM

## 2014-03-15 DIAGNOSIS — E1165 Type 2 diabetes mellitus with hyperglycemia: Secondary | ICD-10-CM

## 2014-05-17 IMAGING — CR DG HIP COMPLETE 2+V*R*
2 series · 2 of 2 positions shown · non-contrast
Comparison: None.

CLINICAL DATA: Posterior right hip pain for several years with no
known injury

RIGHT HIP - COMPLETE 2+ VIEW

[t hip ap right]
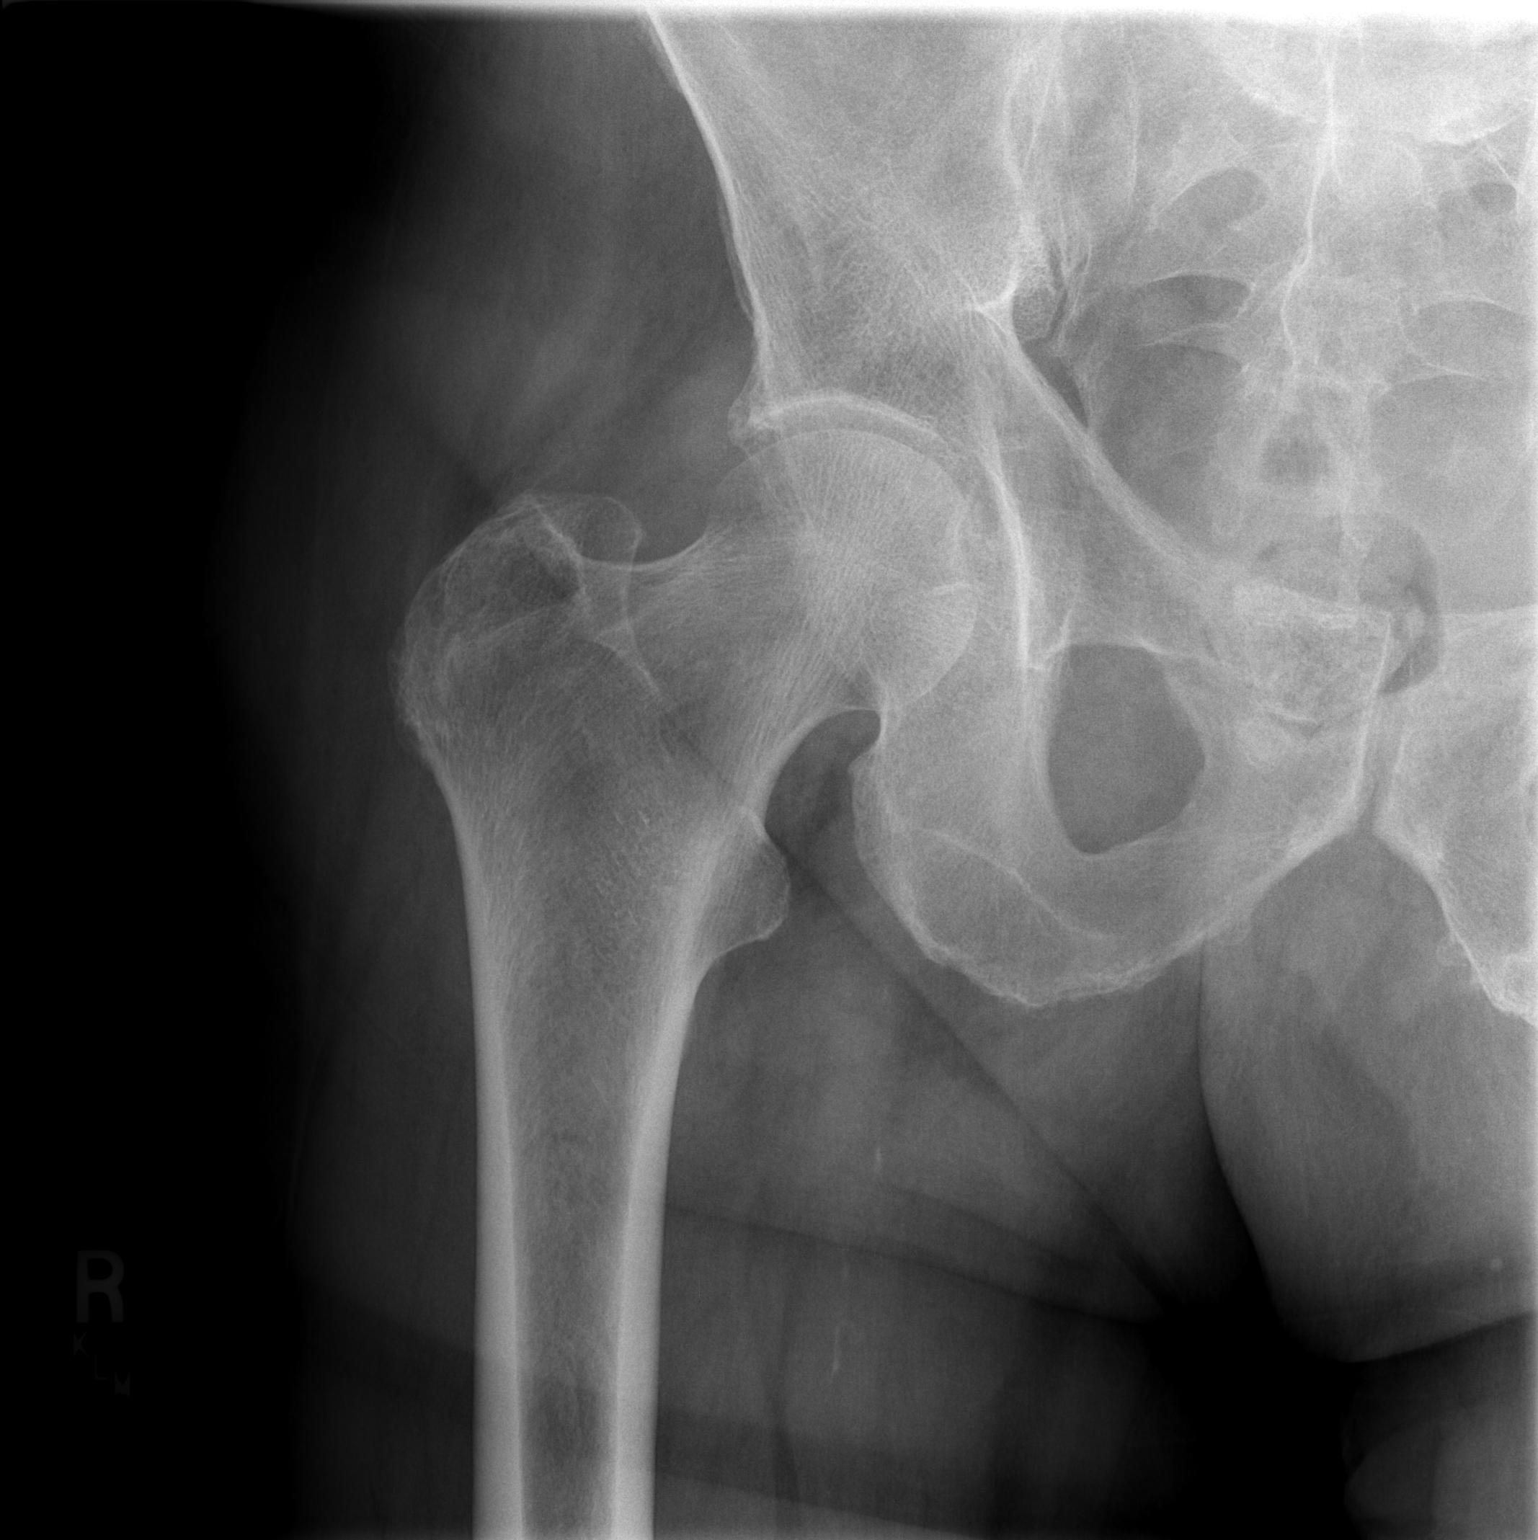

[t hip frog leg right]
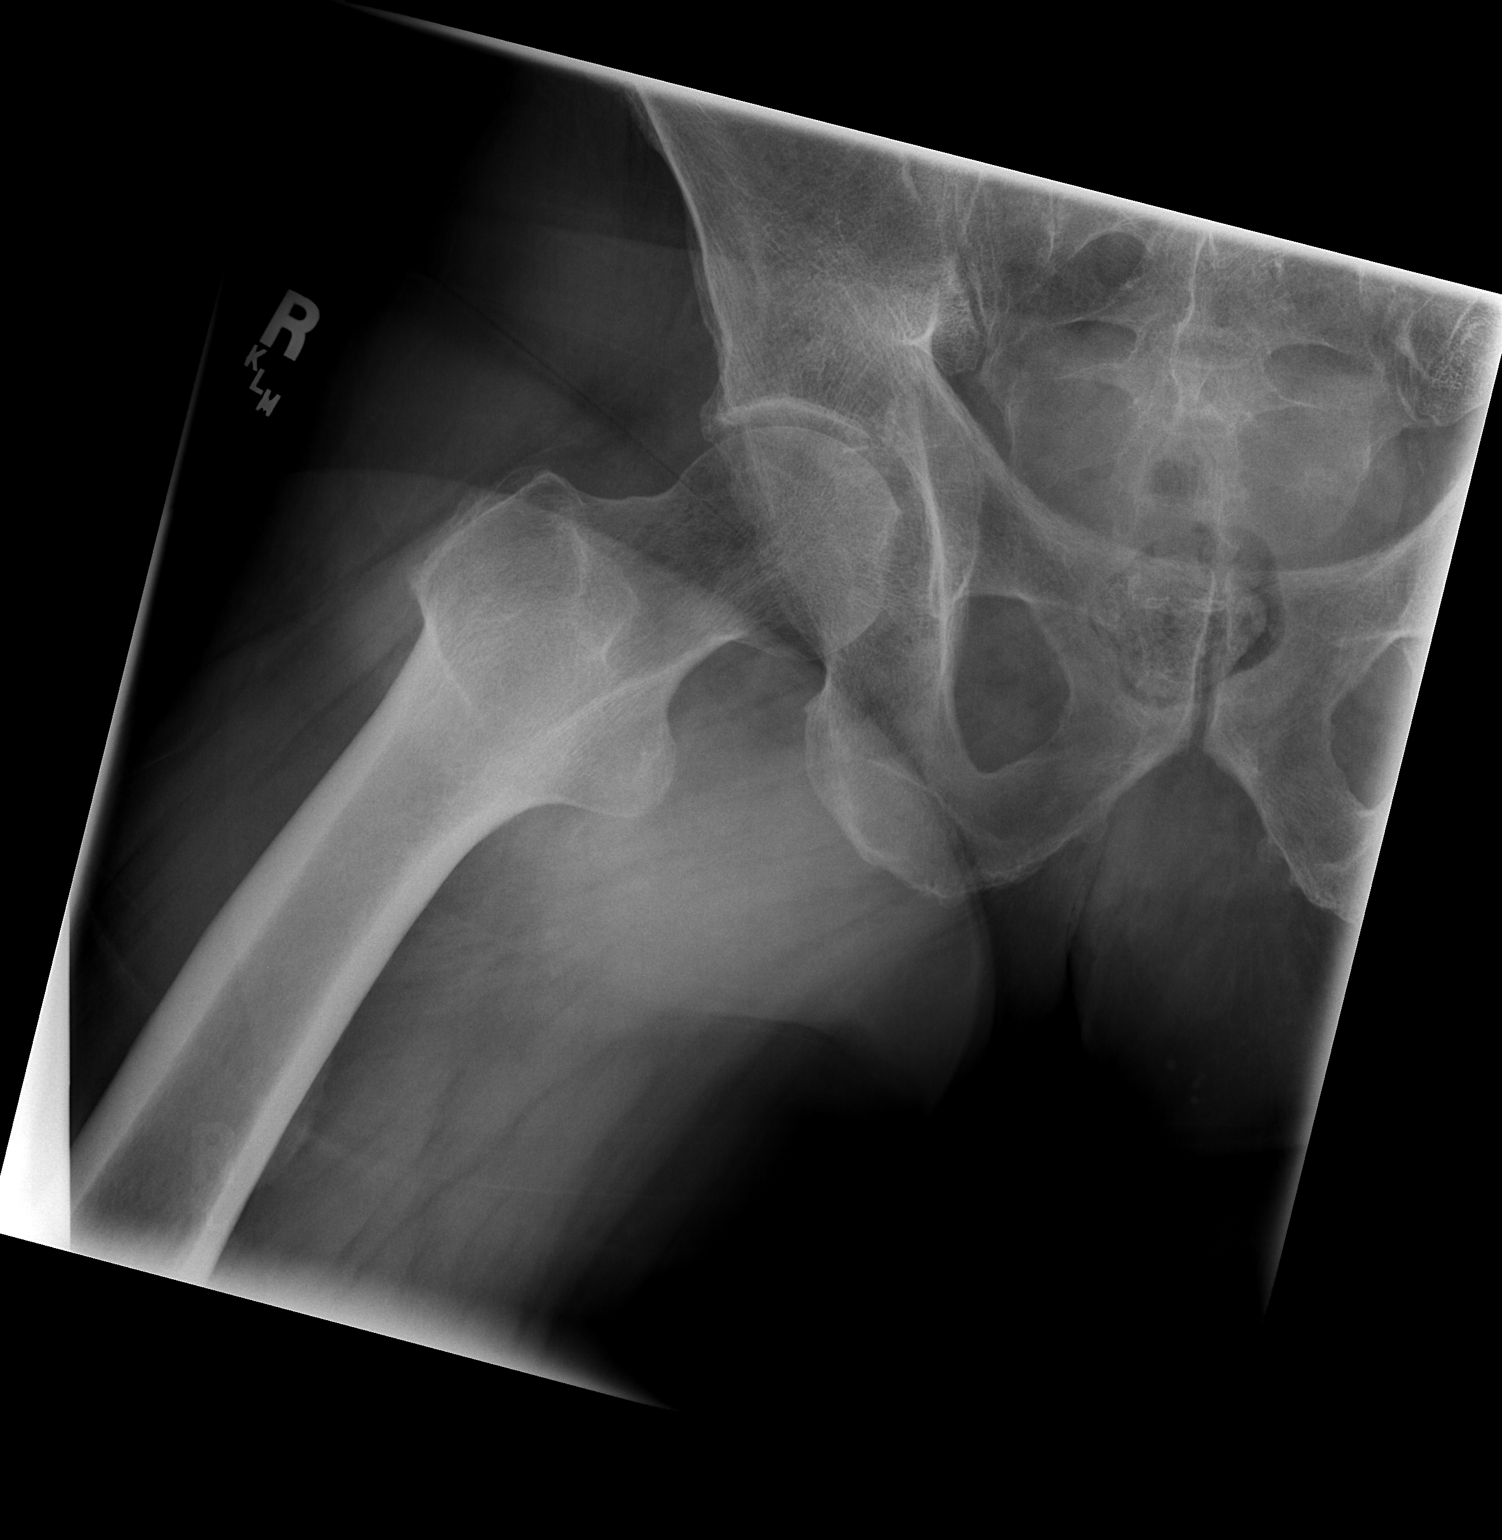

[2 of 2 positions shown; findings below may reference images not displayed]

FINDINGS: Bone density is within normal limits for age.  There is
mild superior joint space narrowing identified with associated
subchondral sclerosis compatible with mild degenerative change.  No
worrisome focal or acute abnormality is suggested.  The visualized
sacral white lines and sacroiliac joints appear normal.

A surgical clip is seen in the right medial thigh compatible with
history of prior femoral bypass grafting.  Mild femoral artery
calcification is noted
IMPRESSION: Mild degenerative change with no acute or worrisome focal bony
abnormality seen

## 2014-11-22 LAB — HM DIABETES EYE EXAM

## 2014-11-30 ENCOUNTER — Ambulatory Visit (INDEPENDENT_AMBULATORY_CARE_PROVIDER_SITE_OTHER): Payer: Medicare Other | Admitting: Family Medicine

## 2014-11-30 ENCOUNTER — Encounter: Payer: Self-pay | Admitting: Family Medicine

## 2014-11-30 VITALS — BP 110/60 | HR 62 | Temp 97.6°F | Wt 228.0 lb

## 2014-11-30 DIAGNOSIS — Z23 Encounter for immunization: Secondary | ICD-10-CM

## 2014-11-30 DIAGNOSIS — J209 Acute bronchitis, unspecified: Secondary | ICD-10-CM

## 2014-11-30 MED ORDER — AMOXICILLIN 875 MG PO TABS: 875.0000 mg | ORAL_TABLET | Freq: Two times a day (BID) | ORAL | Status: DC

## 2014-11-30 NOTE — Progress Notes (Signed)
   Subjective:    Patient ID: [name], male    DOB: 1930/05/07, 80 y.o.   MRN: [id number]  HPI He is here for consult concerning a two-week history of difficulty with cough and congestion as well as weakness. The cough is intermittently productive. No fever, chills, sore throat or earache. He does have underlying diabetes, COPD, heart disease. All his major medical concerns are covered by the [place]. He sees them regularly. He is having no difficulty with his mentation or depression. He reports no recent falls. Continues on medications listed in the chart. Recently his metoprolol was discontinued.   Review of Systems     Objective:   Physical Exam alert and in no distress. Tympanic membranes and canals are normal. Throat is clear. Tonsils are normal. Neck is supple without adenopathy or thyromegaly. Cardiac exam shows a regular sinus rhythm without murmurs or gallops. Lungs are clear to auscultation.        Assessment & Plan:  Acute bronchitis, unspecified organism - Plan: amoxicillin (AMOXIL) 875 MG tablet  Need for prophylactic vaccination and inoculation against influenza - Plan: Flu vaccine HIGH DOSE PF (Fluzone Tri High dose)  he will continue to be followed by the [place]. He will call me if the antibiotic is not successful

## 2014-12-01 ENCOUNTER — Encounter: Payer: Self-pay | Admitting: Internal Medicine

## 2015-01-08 NOTE — Progress Notes (Signed)
Patient ID: [name], male   DOB: October 08, 1930, 81 y.o.   MRN: [id number]    This is a 81 year old Caucasian male withperipheral vascular disease. He is status post abdominal aortic aneurysmrepair with bilateral common iliac aneurysm repair with an aorto biiliacbypass graft as well as a left aortorenal bypass in 1996. 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 Patient began to complain of right lower extremity claudication which has been going on for a little more than a year includingright hip pain with ambulation. The patient denies rest pain. ABIs performed on November 25, 2004 reveal 0.58 on the right and greater than 1on the left. There are monophasic wave forms throughout the right lowerextremity. He is status post coronary artery bypass grafting in 1996 by Dr.Gerhardt. His stress Cardiolite performed in 2004 revealed an ejection fraction of 60% and no ischemia. An abdominal and pelvic CT scan revealedthe aneurysm repairs were intact and that there was an occlusion of theproximal right common iliac artery graft with reconstitution distally. Anaortogram was then performed on December 27, 2004 which also revealed anocclusion of the right limb of the aorto bi-iliac bypass graft. Patientdoes complain of hip pain with ambulation as well as some low back pain. Hedenies rest pain, erythema, edema, ulcerations, tenderness, fever, chills, mental status changes, shortness of breath, angina, and palpitations  Had subsequent fem fem for improved LLE pefusion in 2006 June 2014  Left ICA 60-79% stenosis.  No TIA symptoms.   40-59% right ICA   Was in [place] and seen in ER twice for what sounds like URI, congestion.  Rx with amoxacillin      ROS: Denies fever, malais, weight loss, blurry vision, decreased visual acuity, cough, sputum, SOB, hemoptysis, pleuritic pain, palpitaitons, heartburn, abdominal pain, melena, lower  extremity edema, claudication, or rash.  All other systems reviewed and negative   General: Affect appropriate Obese male  HEENT: normal Neck supple with no adenopathy JVP normal bilateral  bruits no thyromegaly Lungs clear with no wheezing and good diaphragmatic motion Heart:  S1/S2 no murmur,rub, gallop or click PMI normal Abdomen: benighn, large ventral hernia  AAA repair  no bruit.  No HSM or HJR Distal pulses intact  Left femoral bruit with fem fem bypass  Plus one bilateral edema Neuro non-focal Skin warm and dry No muscular weakness  Medications Current Outpatient Prescriptions  Medication Sig Dispense Refill  . albuterol (PROVENTIL HFA;VENTOLIN HFA) 108 (90 BASE) MCG/ACT inhaler Inhale 2 puffs into the lungs every 6 (six) hours as needed for wheezing.    Marland Kitchen amoxicillin (AMOXIL) 875 MG tablet Take 1 tablet (875 mg total) by mouth 2 (two) times daily. 20 tablet 0  . aspirin 81 MG tablet Take 81 mg by mouth daily.    Marland Kitchen atorvastatin (LIPITOR) 40 MG tablet Take 40 mg by mouth daily. PT TAKES 1/2 TABLET AT NIGHT    . B-D ULTRAFINE III SHORT PEN 31G X 8 MM MISC USE TO INJECT LANTUS INSULIN DAILY 100 each 0  . cyanocobalamin (,VITAMIN B-12,) 1000 MCG/ML injection Inject 1,000 mcg into the muscle every 30 (thirty) days.    . enalapril (VASOTEC) 20 MG tablet TAKE 1 TABLET EVERY DAY (Patient taking differently: TAKE 1 TABLET EVERY DAY belive it is 5 mg now) 90 tablet 2  . insulin glargine (LANTUS) 100 UNIT/ML injection Inject 50 Units into the skin at bedtime.    . metFORMIN (GLUCOPHAGE) 1000 MG tablet TAKE 1 TABLET BY MOUTH TWICE A  DAY 180 tablet 1  . metoprolol (LOPRESSOR) 50 MG tablet Take 50 mg by mouth 2 (two) times daily. Pt takes 1/2 am 1/2 pm    . NIASPAN 500 MG CR tablet TAKE 1 TABLET EVERY DAY 30 tablet 0  . nitroGLYCERIN (NITROSTAT) 0.4 MG SL tablet Place 1 tablet (0.4 mg total) under the tongue every 5 (five) minutes as needed. 25 tablet 6  . tiotropium (SPIRIVA HANDIHALER)  18 MCG inhalation capsule Place 18 mcg into inhaler and inhale daily.     No current facility-administered medications for this visit.    Allergies Review of patient's allergies indicates no known allergies.  Family History: Family History  Problem Relation Age of Onset  . Melanoma    . Heart disease    . Alcohol abuse    . Diabetes      Social History: History   Social History  . Marital Status: Widowed    Spouse Name: N/A    Number of Children: N/A  . Years of Education: N/A   Occupational History  . Not on file.   Social History Main Topics  . Smoking status: Former Research scientist (life sciences)  . Smokeless tobacco: Not on file  . Alcohol Use: 0.6 oz/week    1 Glasses of wine per week  . Drug Use: No  . Sexual Activity: Not Currently   Other Topics Concern  . Not on file   Social History Narrative    Past Surgical History  Procedure Laterality Date  . Femoral artery aneurysm repair  01/14/05  . Femoral-femoral bypass graft  01/14/05  . Tonsillectomy    . Basal cell carcinoma excision  1994  . Coronary artery bypass graft      Past Medical History  Diagnosis Date  . CAD (coronary artery disease)   . Renal artery atherosclerosis   . AAA (abdominal aortic aneurysm)   . Carotid artery stenosis   . History of ASCVD (atherosclerotic cardiovascular disease)   . HTN (hypertension)   . Arterial stenosis   . PVD (peripheral vascular disease)   . HLD (hyperlipidemia)   . DM (diabetes mellitus)     Electrocardiogram:  SR rate 68  LVH with strain PR 80 first degree old IMI    Assessment and Plan

## 2015-01-09 ENCOUNTER — Encounter: Payer: Self-pay | Admitting: Cardiovascular Disease

## 2015-01-09 ENCOUNTER — Ambulatory Visit (INDEPENDENT_AMBULATORY_CARE_PROVIDER_SITE_OTHER): Payer: Medicare Other | Admitting: Cardiovascular Disease

## 2015-01-09 VITALS — BP 126/68 | HR 68 | Ht 69.5 in | Wt 232.0 lb

## 2015-01-09 DIAGNOSIS — R0989 Other specified symptoms and signs involving the circulatory and respiratory systems: Secondary | ICD-10-CM | POA: Diagnosis not present

## 2015-01-09 DIAGNOSIS — Z0000 Encounter for general adult medical examination without abnormal findings: Secondary | ICD-10-CM | POA: Diagnosis not present

## 2015-01-09 DIAGNOSIS — E785 Hyperlipidemia, unspecified: Secondary | ICD-10-CM

## 2015-01-09 DIAGNOSIS — I739 Peripheral vascular disease, unspecified: Secondary | ICD-10-CM | POA: Diagnosis not present

## 2015-01-09 DIAGNOSIS — Z9889 Other specified postprocedural states: Secondary | ICD-10-CM

## 2015-01-09 DIAGNOSIS — Z8679 Personal history of other diseases of the circulatory system: Secondary | ICD-10-CM

## 2015-01-09 DIAGNOSIS — I10 Essential (primary) hypertension: Secondary | ICD-10-CM | POA: Diagnosis not present

## 2015-01-09 DIAGNOSIS — I2510 Atherosclerotic heart disease of native coronary artery without angina pectoris: Secondary | ICD-10-CM | POA: Diagnosis not present

## 2015-01-09 LAB — BASIC METABOLIC PANEL
BUN: 23 mg/dL (ref 6–23)
CO2: 27 mEq/L (ref 19–32)
Calcium: 8.6 mg/dL (ref 8.4–10.5)
Chloride: 103 mEq/L (ref 96–112)
Creatinine, Ser: 1.27 mg/dL (ref 0.40–1.50)
GFR: 57.57 mL/min — ABNORMAL LOW (ref >60.00)
Glucose, Bld: 202 mg/dL — ABNORMAL HIGH (ref 70–99)
Potassium: 4.7 mEq/L (ref 3.5–5.1)
SODIUM: 135 meq/L (ref 135–145)

## 2015-01-09 LAB — CBC WITH DIFFERENTIAL/PLATELET
BASOS ABS: 0.1 10*3/uL (ref 0.0–0.1)
BASOS PCT: 0.6 % (ref 0.0–3.0)
EOS ABS: 0.3 10*3/uL (ref 0.0–0.7)
EOS PCT: 2.8 % (ref 0.0–5.0)
HEMATOCRIT: 35.2 % — AB (ref 39.0–52.0)
HEMOGLOBIN: 11.9 g/dL — AB (ref 13.0–17.0)
LYMPHS ABS: 2.4 10*3/uL (ref 0.7–4.0)
LYMPHS PCT: 22.1 % (ref 12.0–46.0)
MCHC: 33.7 g/dL (ref 30.0–36.0)
MCV: 94 fl (ref 78.0–100.0)
MONOS PCT: 10.8 % (ref 3.0–12.0)
Monocytes Absolute: 1.2 10*3/uL — ABNORMAL HIGH (ref 0.1–1.0)
NEUTROS ABS: 6.8 10*3/uL (ref 1.4–7.7)
Neutrophils Relative %: 63.7 % (ref 43.0–77.0)
Platelets: 212 10*3/uL (ref 150.0–400.0)
RBC: 3.74 Mil/uL — AB (ref 4.22–5.81)
RDW: 14.9 % (ref 11.5–15.5)
WBC: 10.8 10*3/uL — ABNORMAL HIGH (ref 4.0–10.5)

## 2015-01-09 NOTE — Assessment & Plan Note (Signed)
Ambulation limited by fatigue but last ABI on record only about .5 on left  Will check ABI' sand LE arterial duplex of fem-fem and have  Him f/u with Dr Gwenlyn Found for his vascular disease

## 2015-01-09 NOTE — Assessment & Plan Note (Signed)
History of occluded bypass to left kideny  F/U BMET needs Abdominal US  For AAA repair and can check renal blood flow to right

## 2015-01-09 NOTE — Assessment & Plan Note (Signed)
No chest pain ECG chronically abnormal no need for myovue at this time

## 2015-01-09 NOTE — Patient Instructions (Addendum)
Your physician wants you to follow-up in:   [place] will receive a reminder letter in the mail two months in advance. If you don't receive a letter, please call our office to schedule the follow-up appointment. Your physician has recommended you make the following change in your medication:  DECREASE   ENALAPRIL TO  10 MG  IF TAKING  20 MG Your physician recommends that you return for lab work in:  [place] has requested that you have an abdominal aorta duplex. During this test, an ultrasound is used to evaluate the aorta. Allow 30 minutes for this exam. Do not eat after midnight the day before and avoid carbonated beverages   Your physician has requested that you have a lower or upper extremity arterial duplex. This test is an ultrasound of the arteries in the legs or arms. It looks at arterial blood flow in the legs and arms. Allow one hour for Lower and Upper Arterial scans. There are no restrictions or special instructions LOWER WITH ABI'S   Your physician has requested that you have a carotid duplex. This test is an ultrasound of the carotid arteries in your neck. It looks at blood flow through these arteries that supply the brain with blood. Allow one hour for this exam. There are no restrictions or special instructions.

## 2015-01-09 NOTE — Assessment & Plan Note (Signed)
Cholesterol is at goal.  Continue current dose of statin and diet Rx.  No myalgias or side effects.  F/U  LFT's in 6 months. Lab Results  Component Value Date   LDLCALC 117* 11/26/2010  will need f/u lab work but ate this am  [place]

## 2015-01-09 NOTE — Assessment & Plan Note (Signed)
Known disease Left bruit  F/u duplex next week ASA

## 2015-01-09 NOTE — Assessment & Plan Note (Signed)
Significant postural symptoms with BP systolic falling from 677 to 100  MmHg in office  Decrease enalapril to 10 mg daily avoid diuretic if possible

## 2015-01-09 NOTE — Addendum Note (Signed)
Addended by: Devra Dopp E on: 01/09/2015 09:06 AM   Modules accepted: Orders

## 2015-01-23 ENCOUNTER — Encounter: Payer: Self-pay | Admitting: Cardiovascular Disease

## 2015-01-23 ENCOUNTER — Ambulatory Visit (HOSPITAL_COMMUNITY): Payer: Medicare Other | Attending: Cardiovascular Disease | Admitting: Cardiology

## 2015-01-23 ENCOUNTER — Ambulatory Visit (INDEPENDENT_AMBULATORY_CARE_PROVIDER_SITE_OTHER): Payer: Medicare Other | Admitting: Cardiovascular Disease

## 2015-01-23 ENCOUNTER — Ambulatory Visit (INDEPENDENT_AMBULATORY_CARE_PROVIDER_SITE_OTHER): Payer: Medicare Other | Admitting: Family Medicine

## 2015-01-23 ENCOUNTER — Ambulatory Visit (HOSPITAL_BASED_OUTPATIENT_CLINIC_OR_DEPARTMENT_OTHER): Payer: Medicare Other | Admitting: Cardiology

## 2015-01-23 ENCOUNTER — Other Ambulatory Visit (HOSPITAL_COMMUNITY): Payer: Self-pay | Admitting: Cardiology

## 2015-01-23 VITALS — BP 124/70 | HR 57 | Wt 222.0 lb

## 2015-01-23 VITALS — BP 104/60 | HR 64 | Ht 69.5 in | Wt 225.1 lb

## 2015-01-23 DIAGNOSIS — Z87891 Personal history of nicotine dependence: Secondary | ICD-10-CM | POA: Diagnosis not present

## 2015-01-23 DIAGNOSIS — R42 Dizziness and giddiness: Secondary | ICD-10-CM | POA: Diagnosis not present

## 2015-01-23 DIAGNOSIS — I739 Peripheral vascular disease, unspecified: Secondary | ICD-10-CM

## 2015-01-23 DIAGNOSIS — I10 Essential (primary) hypertension: Secondary | ICD-10-CM | POA: Insufficient documentation

## 2015-01-23 DIAGNOSIS — Z9889 Other specified postprocedural states: Secondary | ICD-10-CM

## 2015-01-23 DIAGNOSIS — I701 Atherosclerosis of renal artery: Secondary | ICD-10-CM | POA: Diagnosis not present

## 2015-01-23 DIAGNOSIS — E785 Hyperlipidemia, unspecified: Secondary | ICD-10-CM | POA: Diagnosis not present

## 2015-01-23 DIAGNOSIS — I700 Atherosclerosis of aorta: Secondary | ICD-10-CM

## 2015-01-23 DIAGNOSIS — E119 Type 2 diabetes mellitus without complications: Secondary | ICD-10-CM | POA: Insufficient documentation

## 2015-01-23 DIAGNOSIS — Z8679 Personal history of other diseases of the circulatory system: Secondary | ICD-10-CM

## 2015-01-23 DIAGNOSIS — I6523 Occlusion and stenosis of bilateral carotid arteries: Secondary | ICD-10-CM

## 2015-01-23 DIAGNOSIS — D649 Anemia, unspecified: Secondary | ICD-10-CM | POA: Diagnosis not present

## 2015-01-23 DIAGNOSIS — R0602 Shortness of breath: Secondary | ICD-10-CM | POA: Diagnosis not present

## 2015-01-23 DIAGNOSIS — I2510 Atherosclerotic heart disease of native coronary artery without angina pectoris: Secondary | ICD-10-CM

## 2015-01-23 DIAGNOSIS — Z951 Presence of aortocoronary bypass graft: Secondary | ICD-10-CM | POA: Insufficient documentation

## 2015-01-23 DIAGNOSIS — Z0000 Encounter for general adult medical examination without abnormal findings: Secondary | ICD-10-CM

## 2015-01-23 NOTE — Progress Notes (Signed)
Abdominal Aorta Duplex performed  

## 2015-01-23 NOTE — Progress Notes (Signed)
   Subjective:    Patient ID: [name], male    DOB: 1930/03/08, 81 y.o.   MRN: [id number]  HPI    Review of Systems     Objective:   Physical Exam        Assessment & Plan:  He is followed at the [facility] for his diabetes and states his most recent hemoglobin A1c was 7.4.

## 2015-01-23 NOTE — Progress Notes (Signed)
Carotid duplex performed   Results given to Dr.Arida >25% LICA stenosis

## 2015-01-23 NOTE — Progress Notes (Signed)
Lower arterial Doppler and Duplex performed

## 2015-01-23 NOTE — Patient Instructions (Signed)
Take a good multivitamin with iron daily. I'll check your blood count in a month

## 2015-01-23 NOTE — Assessment & Plan Note (Signed)
He has chronic exertional dyspnea with no recent ischemic evaluation. I recommend a pharmacologic nuclear stress test before anticipated carotid endarterectomy.

## 2015-01-23 NOTE — Assessment & Plan Note (Signed)
Carotid Doppler today showed progression of left carotid stenosis greater than 56% with a systolic velocity of 812 and diastolic velocity of 751. There was 60-79% right carotid stenosis. I discussed with him the indication for revascularization in order to decrease his future risk of stroke. The patient wants to proceed but wants to wait until he comes back from [place] in March. He will be referred to [facility] at that time.

## 2015-01-23 NOTE — Progress Notes (Signed)
HPI  This is a 81 year old Caucasian male who was referred by Dr. Johnsie Cancel for evaluation and management of peripheral vascular disease. He is status post abdominal aortic aneurysmrepair with bilateral common iliac aneurysm repair with an aorto biiliacbypass graft as well as a left aortorenal bypass in 1996. 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 He underwent left-to-right femorofemoral bypass. He has not had any claudication since then.  He has known history of coronary artery disease status post coronary artery bypass grafting in 1996 by Dr.Gerhardt.No recent stress testing.  He is known to have bilateral carotid stenosis worse on the left side. No previous history of stroke. He has been doing reasonably well and denies any chest pain. He has chronic exertional dyspnea.  No Known Allergies   Current Outpatient Prescriptions on File Prior to Visit  Medication Sig Dispense Refill  . albuterol (PROVENTIL HFA;VENTOLIN HFA) 108 (90 BASE) MCG/ACT inhaler Inhale 2 puffs into the lungs every 6 (six) hours as needed for wheezing.    Marland Kitchen aspirin 81 MG tablet Take 81 mg by mouth daily.    . B-D ULTRAFINE III SHORT PEN 31G X 8 MM MISC USE TO INJECT LANTUS INSULIN DAILY 100 each 0  . budesonide-formoterol (SYMBICORT) 160-4.5 MCG/ACT inhaler Inhale 2 puffs into the lungs 2 (two) times daily.    . cyanocobalamin (,VITAMIN B-12,) 1000 MCG/ML injection Inject 1,000 mcg into the muscle every 30 (thirty) days.    . enalapril (VASOTEC) 5 MG tablet Take 2.5 mg by mouth daily.    . insulin glargine (LANTUS) 100 UNIT/ML injection Inject 50 Units into the skin at bedtime.    . metoprolol (LOPRESSOR) 50 MG tablet Take 25 mg by mouth 2 (two) times daily. Pt takes 1/2 am 1/2 pm    . Multiple Vitamins-Minerals (VITEYES AREDS FORMULA) CAPS Take 1 capsule  by mouth 2 (two) times daily.    . nitroGLYCERIN (NITROSTAT) 0.4 MG SL tablet Place 1 tablet (0.4 mg total) under the tongue every 5 (five) minutes as needed. 25 tablet 6  . tiotropium (SPIRIVA HANDIHALER) 18 MCG inhalation capsule Place 18 mcg into inhaler and inhale daily.     No current facility-administered medications on file prior to visit.     Past Medical History  Diagnosis Date  . CAD (coronary artery disease)   . Renal artery atherosclerosis   . AAA (abdominal aortic aneurysm)   . Carotid artery stenosis   . History of ASCVD (atherosclerotic cardiovascular disease)   . HTN (hypertension)   . Arterial stenosis   . PVD (peripheral vascular disease)   . HLD (hyperlipidemia)   . DM (diabetes mellitus)      Past Surgical History  Procedure Laterality Date  . Femoral artery aneurysm repair  01/14/05  . Femoral-femoral bypass graft  01/14/05  . Tonsillectomy    . Basal cell carcinoma excision  1994  . Coronary artery bypass graft       Family History  Problem Relation Age of Onset  . Melanoma    . Heart disease    . Alcohol abuse    . Diabetes       History   Social History  . Marital Status: Widowed    Spouse Name: N/A    Number of Children: N/A  . Years of Education: N/A   Occupational History  . Not on file.   Social History Main  Topics  . Smoking status: Former Research scientist (life sciences)  . Smokeless tobacco: Not on file  . Alcohol Use: 0.6 oz/week    1 Glasses of wine per week  . Drug Use: No  . Sexual Activity: Not Currently   Other Topics Concern  . Not on file   Social History Narrative     ROS A 10 point review of system was performed. It is negative other than that mentioned in the history of present illness.   PHYSICAL EXAM   BP 104/60 mmHg  Pulse 64  Ht 5' 9.5" (1.765 m)  Wt 225 lb 1.9 oz (102.114 kg)  BMI 32.78 kg/m2  SpO2 98% Constitutional: He is oriented to person, place, and time. He appears well-developed and well-nourished. No distress.   HENT: No nasal discharge.  Head: Normocephalic and atraumatic.  Eyes: Pupils are equal and round.  No discharge. Neck: Normal range of motion. Neck supple. No JVD present. No thyromegaly present.  Cardiovascular: Normal rate, regular rhythm, normal heart sounds. Exam reveals no gallop and no friction rub. No murmur heard.  Pulmonary/Chest: Effort normal and breath sounds normal. No stridor. No respiratory distress. He has no wheezes. He has no rales. He exhibits no tenderness.  Abdominal: Soft. Bowel sounds are normal. He exhibits no distension. There is no tenderness. There is no rebound and no guarding.  Musculoskeletal: Normal range of motion. He exhibits no edema and no tenderness.  Neurological: He is alert and oriented to person, place, and time. Coordination normal.  Skin: Skin is warm and dry. No rash noted. He is not diaphoretic. No erythema. No pallor.  Psychiatric: He has a normal mood and affect. His behavior is normal. Judgment and thought content normal.  Vascular: Radial pulses are normal bilaterally. Femoral pulses are normal bilaterally. Distal pulses are diminished.     ASSESSMENT AND PLAN

## 2015-01-23 NOTE — Patient Instructions (Addendum)
Your physician wants you to follow-up in: 1 YEAR with Dr Fletcher Anon. You will receive a reminder letter in the mail two months in advance. If you don't receive a letter, please call our office to schedule the follow-up appointment.  Your physician has requested that you have a lexiscan myoview. For further information please visit [email or web address]. Please follow instruction sheet, as given. Please call the office to schedule when you return from [place].  Your physician recommends that you continue on your current medications as directed. Please refer to the Current Medication list given to you today.

## 2015-01-23 NOTE — Assessment & Plan Note (Signed)
The patient has no claudication. Aortoiliac duplex showed patent aortofemoral bypass graft and patent left to right femorofemoral bypass. There was borderline stenosis of the left external iliac artery. This should be monitored. His femoral pulses are normal.

## 2015-01-23 NOTE — Progress Notes (Signed)
   Subjective:    Patient ID: [name], male    DOB: 14-Jul-1930, 81 y.o.   MRN: [id number]  HPI He is here for consult concerning anemia. Recent blood work did show a slightly low hemoglobin. He is taking care of at the [place] and apparently 3 months ago he was started on iron and B12 shots. He stopped taking the iron due to having a dry mouth but now has started back on a much lower dose. He is scheduled for another B12 shot today.   Review of Systems     Objective:   Physical Exam Alert and in no distress otherwise not examined Hemoglobin is 11.9 and MCV is in the normal range.      Assessment & Plan:  Anemia, unspecified anemia type  I can only assume that the [place] has blood work which did show an anemia since he was placed both on iron and B12 shots. I will recheck these numbers again in one month and recommended that he take a multivitamin with iron.

## 2015-01-26 ENCOUNTER — Other Ambulatory Visit: Payer: Self-pay | Admitting: *Deleted

## 2015-01-26 DIAGNOSIS — I6529 Occlusion and stenosis of unspecified carotid artery: Secondary | ICD-10-CM

## 2015-01-29 ENCOUNTER — Telehealth: Payer: Self-pay | Admitting: Cardiovascular Disease

## 2015-01-29 NOTE — Telephone Encounter (Signed)
New message    Patient call stating someone called him today.

## 2015-01-29 NOTE — Telephone Encounter (Signed)
Patient thinks someone may be calling him about scheduling a stress test. Will forward to Devra Dopp, Dr. Johnsie Cancel nurse to follow up with patient when she gets back on Wednesday. Patient aware and verbalized understanding.

## 2015-01-31 NOTE — Telephone Encounter (Signed)
APPEARS  DR  Pillager  AT  2-9-  16 OFFICE VISIT   NOT SURE NEEDS   APPT  ON  02-06-15 WITH DR ARIDA  ./CY

## 2015-01-31 NOTE — Telephone Encounter (Signed)
SPOKE WITH PT   RE MESSAGE  PER PT  DID NOT  GO TO [place]  IS WANTING  TO  SCHEDULE  LEXISCAN   SCHEDULERS  AWARE  WILL CALL PT  THIS  AFTERNOON AND  SCHEDULE  .Adonis Housekeeper

## 2015-01-31 NOTE — Telephone Encounter (Signed)
F/U        Pt is returning call from today.   Please call.

## 2015-02-05 ENCOUNTER — Ambulatory Visit (HOSPITAL_COMMUNITY): Payer: Medicare Other | Attending: Cardiovascular Disease | Admitting: Radiology

## 2015-02-05 DIAGNOSIS — E119 Type 2 diabetes mellitus without complications: Secondary | ICD-10-CM | POA: Insufficient documentation

## 2015-02-05 DIAGNOSIS — I10 Essential (primary) hypertension: Secondary | ICD-10-CM | POA: Diagnosis not present

## 2015-02-05 DIAGNOSIS — I2510 Atherosclerotic heart disease of native coronary artery without angina pectoris: Secondary | ICD-10-CM

## 2015-02-05 DIAGNOSIS — Z794 Long term (current) use of insulin: Secondary | ICD-10-CM | POA: Diagnosis not present

## 2015-02-05 DIAGNOSIS — R0602 Shortness of breath: Secondary | ICD-10-CM | POA: Diagnosis not present

## 2015-02-05 DIAGNOSIS — R0609 Other forms of dyspnea: Secondary | ICD-10-CM | POA: Insufficient documentation

## 2015-02-05 DIAGNOSIS — I739 Peripheral vascular disease, unspecified: Secondary | ICD-10-CM | POA: Diagnosis not present

## 2015-02-05 MED ORDER — TECHNETIUM TC 99M SESTAMIBI GENERIC - CARDIOLITE
30.0000 | Freq: Once | INTRAVENOUS | Status: AC | PRN
  Administered 2015-02-05: 30 via INTRAVENOUS

## 2015-02-05 MED ORDER — TECHNETIUM TC 99M SESTAMIBI GENERIC - CARDIOLITE
10.0000 | Freq: Once | INTRAVENOUS | Status: AC | PRN
  Administered 2015-02-05: 10 via INTRAVENOUS

## 2015-02-05 MED ORDER — REGADENOSON 0.4 MG/5ML IV SOLN
0.4000 mg | Freq: Once | INTRAVENOUS | Status: AC
  Administered 2015-02-05: 0.4 mg via INTRAVENOUS

## 2015-02-05 NOTE — Progress Notes (Signed)
[facility] [address] [place], [place] [address] [phone]    Cardiology Nuclear Med Study  [name] is a 81 y.o. male     MRN : [id number]     DOB: February 09, 1930  Procedure Date: 02/05/2015  Nuclear Med Background Indication for Stress Test:  Evaluation for Ischemia, Graft Patency, and Pending Surgical Clearance for  (L) CEA  History:  CAD, MPI 2006 (normal) EF 63%, COPD Cardiac Risk Factors: Carotid Disease, Hypertension, IDDM, and PVD  Symptoms:  DOE   Nuclear Pre-Procedure Caffeine/Decaff Intake:  None>12 hrs NPO After: 10:30pm   Lungs:  clear O2 Sat: 96% on room air. IV 0.9% NS with Angio Cath:  22g  IV Site: R Hand x 1, tolerated well IV Started by:  Irven Baltimore, RN  Chest Size (in):  46 Cup Size: n/a  Height: 5' 9.5" (1.765 m)  Weight:  227 lb (102.967 kg)  BMI:  Body mass index is 33.05 kg/(m^2). Tech Comments:  Full dose Lantus Insulin last night. Fasting CBG was 150 at 0600 today.No insulin or Metformin this am. Patient took Lopressor and Spiriva this am. Irven Baltimore, RN.    Nuclear Med Study 1 or 2 day study: 1 day  Stress Test Type:  Lexiscan  Reading MD: N/A  Order Authorizing Provider:  Kathlyn Sacramento, MD  Resting Radionuclide: Technetium 67m Sestamibi  Resting Radionuclide Dose: 11.0 mCi   Stress Radionuclide:  Technetium 54m Sestamibi  Stress Radionuclide Dose: 33.0 mCi           Stress Protocol Rest HR: 61 Stress HR: 75  Rest BP: 127/76 Stress BP: 146/83  Exercise Time (min): n/a METS: n/a   Predicted Max HR: 137 bpm % Max HR: 54.74 bpm Rate Pressure Product: 10950   Dose of Adenosine (mg):  n/a Dose of Lexiscan: 0.4 mg  Dose of Atropine (mg): n/a Dose of Dobutamine: n/a mcg/kg/min (at max HR)  Stress Test Technologist: Glade Lloyd, BS-ES  Nuclear Technologist:  Earl Many, CNMT     Rest Procedure:  Myocardial perfusion imaging was performed at rest 45 minutes following the intravenous administration  of Technetium 72m Sestamibi. Rest ECG: NSR, LVH with repolarization abnormality.  Stress Procedure:  The patient received IV Lexiscan 0.4 mg over 15-seconds.  Technetium 28m Sestamibi injected at 30-seconds.  Quantitative spect images were obtained after a 45 minute delay. Stress ECG: No significant ST segment change suggestive of ischemia.  QPS Raw Data Images:  Acquisition technically good; LVE. Stress Images:  There is decreased uptake in the anterior lateral wall. Rest Images:  There is decreased uptake in the anterior lateral wall, less prominent compared to the stress images. Subtraction (SDS):  These findings are consistent with prior anterior lateral infarct and moderate peri-infarct ischemia. Transient Ischemic Dilatation (Normal <1.22):  1.08 Lung/Heart Ratio (Normal <0.45):  0.30  Quantitative Gated Spect Images QGS EDV:  173 ml QGS ESV:  114 ml  Impression Exercise Capacity:  Lexiscan with no exercise. BP Response:  Normal blood pressure response. Clinical Symptoms:  No chest pain or dyspnea. ECG Impression:  No significant ST segment change suggestive of ischemia. Comparison with Prior Nuclear Study: No previous nuclear study performed  Overall Impression:  High risk stress nuclear study with a large, severe, partially reversible anterior lateral defect consistent with prior infarct and moderaete peri-infarct ischemia.  LV Ejection Fraction: 34%.  LV Wall Motion:  Global hypokinesis and akinesis of the anterior lateral wall.  [name]

## 2015-02-06 ENCOUNTER — Institutional Professional Consult (permissible substitution): Payer: Medicare Other | Admitting: Cardiovascular Disease

## 2015-02-07 ENCOUNTER — Encounter: Payer: Self-pay | Admitting: Vascular Surgery

## 2015-02-07 ENCOUNTER — Other Ambulatory Visit: Payer: Self-pay | Admitting: *Deleted

## 2015-02-07 DIAGNOSIS — I6523 Occlusion and stenosis of bilateral carotid arteries: Secondary | ICD-10-CM

## 2015-02-08 ENCOUNTER — Encounter: Payer: Self-pay | Admitting: Cardiology

## 2015-02-08 ENCOUNTER — Encounter: Payer: Self-pay | Admitting: Internal Medicine

## 2015-02-11 ENCOUNTER — Other Ambulatory Visit: Payer: Self-pay | Admitting: Cardiovascular Disease

## 2015-02-11 DIAGNOSIS — I25708 Atherosclerosis of coronary artery bypass graft(s), unspecified, with other forms of angina pectoris: Secondary | ICD-10-CM

## 2015-02-14 ENCOUNTER — Encounter (HOSPITAL_COMMUNITY): Payer: Self-pay | Admitting: Cardiovascular Disease

## 2015-02-14 ENCOUNTER — Ambulatory Visit (HOSPITAL_COMMUNITY)
Admission: RE | Admit: 2015-02-14 | Discharge: 2015-02-14 | Disposition: A | Discharge status: Home / Self Care | Payer: Medicare Other | Source: Ambulatory Visit | Attending: Cardiovascular Disease | Admitting: Cardiovascular Disease

## 2015-02-14 ENCOUNTER — Encounter (HOSPITAL_COMMUNITY)
Admission: RE | Disposition: A | Discharge status: Home / Self Care | Payer: Self-pay | Source: Ambulatory Visit | Attending: Cardiovascular Disease

## 2015-02-14 DIAGNOSIS — Z791 Long term (current) use of non-steroidal anti-inflammatories (NSAID): Secondary | ICD-10-CM | POA: Insufficient documentation

## 2015-02-14 DIAGNOSIS — Z79899 Other long term (current) drug therapy: Secondary | ICD-10-CM | POA: Diagnosis not present

## 2015-02-14 DIAGNOSIS — I2510 Atherosclerotic heart disease of native coronary artery without angina pectoris: Secondary | ICD-10-CM | POA: Insufficient documentation

## 2015-02-14 DIAGNOSIS — R9439 Abnormal result of other cardiovascular function study: Secondary | ICD-10-CM | POA: Diagnosis present

## 2015-02-14 DIAGNOSIS — E785 Hyperlipidemia, unspecified: Secondary | ICD-10-CM | POA: Diagnosis not present

## 2015-02-14 DIAGNOSIS — E119 Type 2 diabetes mellitus without complications: Secondary | ICD-10-CM | POA: Insufficient documentation

## 2015-02-14 DIAGNOSIS — I10 Essential (primary) hypertension: Secondary | ICD-10-CM | POA: Insufficient documentation

## 2015-02-14 DIAGNOSIS — Z794 Long term (current) use of insulin: Secondary | ICD-10-CM | POA: Insufficient documentation

## 2015-02-14 DIAGNOSIS — Z7982 Long term (current) use of aspirin: Secondary | ICD-10-CM | POA: Insufficient documentation

## 2015-02-14 DIAGNOSIS — Z87891 Personal history of nicotine dependence: Secondary | ICD-10-CM | POA: Insufficient documentation

## 2015-02-14 DIAGNOSIS — I739 Peripheral vascular disease, unspecified: Secondary | ICD-10-CM | POA: Diagnosis not present

## 2015-02-14 DIAGNOSIS — I714 Abdominal aortic aneurysm, without rupture: Secondary | ICD-10-CM | POA: Diagnosis not present

## 2015-02-14 DIAGNOSIS — I25708 Atherosclerosis of coronary artery bypass graft(s), unspecified, with other forms of angina pectoris: Secondary | ICD-10-CM

## 2015-02-14 HISTORY — PX: LEFT HEART CATHETERIZATION WITH CORONARY/GRAFT ANGIOGRAM: SHX5450

## 2015-02-14 LAB — GLUCOSE, CAPILLARY
GLUCOSE-CAPILLARY: 239 mg/dL — AB (ref 70–99)
Glucose-Capillary: 252 mg/dL — ABNORMAL HIGH (ref 70–99)

## 2015-02-14 LAB — BASIC METABOLIC PANEL
Anion gap: 6 (ref 5–15)
BUN: 18 mg/dL (ref 6–23)
CALCIUM: 9.1 mg/dL (ref 8.4–10.5)
CO2: 26 mmol/L (ref 19–32)
Chloride: 102 mmol/L (ref 96–112)
Creatinine, Ser: 1.24 mg/dL (ref 0.50–1.35)
GFR calc Af Amer: 60 mL/min — ABNORMAL LOW (ref >90)
GFR, EST NON AFRICAN AMERICAN: 52 mL/min — AB (ref >90)
Glucose, Bld: 296 mg/dL — ABNORMAL HIGH (ref 70–99)
Potassium: 4.5 mmol/L (ref 3.5–5.1)
Sodium: 134 mmol/L — ABNORMAL LOW (ref 135–145)

## 2015-02-14 LAB — CBC
HEMATOCRIT: 38.4 % — AB (ref 39.0–52.0)
HEMOGLOBIN: 12.7 g/dL — AB (ref 13.0–17.0)
MCH: 31.8 pg (ref 26.0–34.0)
MCHC: 33.1 g/dL (ref 30.0–36.0)
MCV: 96 fL (ref 78.0–100.0)
Platelets: 155 10*3/uL (ref 150–400)
RBC: 4 MIL/uL — ABNORMAL LOW (ref 4.22–5.81)
RDW: 13.9 % (ref 11.5–15.5)
WBC: 8.8 10*3/uL (ref 4.0–10.5)

## 2015-02-14 LAB — PROTIME-INR
INR: 1.07 (ref 0.00–1.49)
Prothrombin Time: 14.1 seconds (ref 11.6–15.2)

## 2015-02-14 SURGERY — LEFT HEART CATHETERIZATION WITH CORONARY/GRAFT ANGIOGRAM

## 2015-02-14 MED ORDER — SODIUM CHLORIDE 0.9 % IV SOLN
INTRAVENOUS | Status: DC
  Administered 2015-02-14: 10:00:00 via INTRAVENOUS

## 2015-02-14 MED ORDER — ASPIRIN 81 MG PO CHEW: 81.0000 mg | CHEWABLE_TABLET | ORAL | Status: DC

## 2015-02-14 MED ORDER — HEPARIN SODIUM (PORCINE) 1000 UNIT/ML IJ SOLN
INTRAMUSCULAR | Status: AC
  Filled 2015-02-14: qty 1

## 2015-02-14 MED ORDER — HEPARIN (PORCINE) IN NACL 2-0.9 UNIT/ML-% IJ SOLN
INTRAMUSCULAR | Status: AC
  Filled 2015-02-14: qty 1000

## 2015-02-14 MED ORDER — SODIUM CHLORIDE 0.9 % IV SOLN: 250.0000 mL | INTRAVENOUS | Status: DC | PRN

## 2015-02-14 MED ORDER — NITROGLYCERIN 1 MG/10 ML FOR IR/CATH LAB
INTRA_ARTERIAL | Status: AC
Start: 2015-02-14 — End: 2015-02-14
  Filled 2015-02-14: qty 10

## 2015-02-14 MED ORDER — LIDOCAINE HCL (PF) 1 % IJ SOLN
INTRAMUSCULAR | Status: AC
  Filled 2015-02-14: qty 30

## 2015-02-14 MED ORDER — SODIUM CHLORIDE 0.9 % IV SOLN: INTRAVENOUS | Status: DC

## 2015-02-14 MED ORDER — SODIUM CHLORIDE 0.9 % IJ SOLN
3.0000 mL | INTRAMUSCULAR | Status: DC | PRN
Start: 2015-02-14 — End: 2015-02-14

## 2015-02-14 MED ORDER — VERAPAMIL HCL 2.5 MG/ML IV SOLN
INTRAVENOUS | Status: AC
  Filled 2015-02-14: qty 2

## 2015-02-14 MED ORDER — FENTANYL CITRATE 0.05 MG/ML IJ SOLN
INTRAMUSCULAR | Status: AC
  Filled 2015-02-14: qty 2

## 2015-02-14 MED ORDER — SODIUM CHLORIDE 0.9 % IJ SOLN: 3.0000 mL | Freq: Two times a day (BID) | INTRAMUSCULAR | Status: DC

## 2015-02-14 MED ORDER — MIDAZOLAM HCL 2 MG/2ML IJ SOLN
INTRAMUSCULAR | Status: AC
  Filled 2015-02-14: qty 2

## 2015-02-14 NOTE — Interval H&P Note (Signed)
History and Physical Interval Note: He had a nuclear stress test before possible CEA which showed anterior/anterolateral infarct with significant ischemia. EF 34%. High risk stress test.   02/14/2015 12:40 PM  [name]  has presented today for surgery, with the diagnosis of adnormal mioview  The various methods of treatment have been discussed with the patient and family. After consideration of risks, benefits and other options for treatment, the patient has consented to  Procedure(s): LEFT HEART CATHETERIZATION WITH CORONARY/GRAFT ANGIOGRAM (N/A) as a surgical intervention .  The patient's history has been reviewed, patient examined, no change in status, stable for surgery.  I have reviewed the patient's chart and labs.  Questions were answered to the patient's satisfaction.     Kathlyn Sacramento

## 2015-02-14 NOTE — H&P (View-Only) (Signed)
HPI  This is a 81 year old Caucasian male who was referred by Dr. Johnsie Cancel for evaluation and management of peripheral vascular disease. He is status post abdominal aortic aneurysmrepair with bilateral common iliac aneurysm repair with an aorto biiliacbypass graft as well as a left aortorenal bypass in 1996. 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 He underwent left-to-right femorofemoral bypass. He has not had any claudication since then.  He has known history of coronary artery disease status post coronary artery bypass grafting in 1996 by Dr.Gerhardt.No recent stress testing.  He is known to have bilateral carotid stenosis worse on the left side. No previous history of stroke. He has been doing reasonably well and denies any chest pain. He has chronic exertional dyspnea.  No Known Allergies   Current Outpatient Prescriptions on File Prior to Visit  Medication Sig Dispense Refill  . albuterol (PROVENTIL HFA;VENTOLIN HFA) 108 (90 BASE) MCG/ACT inhaler Inhale 2 puffs into the lungs every 6 (six) hours as needed for wheezing.    Marland Kitchen aspirin 81 MG tablet Take 81 mg by mouth daily.    . B-D ULTRAFINE III SHORT PEN 31G X 8 MM MISC USE TO INJECT LANTUS INSULIN DAILY 100 each 0  . budesonide-formoterol (SYMBICORT) 160-4.5 MCG/ACT inhaler Inhale 2 puffs into the lungs 2 (two) times daily.    . cyanocobalamin (,VITAMIN B-12,) 1000 MCG/ML injection Inject 1,000 mcg into the muscle every 30 (thirty) days.    . enalapril (VASOTEC) 5 MG tablet Take 2.5 mg by mouth daily.    . insulin glargine (LANTUS) 100 UNIT/ML injection Inject 50 Units into the skin at bedtime.    . metoprolol (LOPRESSOR) 50 MG tablet Take 25 mg by mouth 2 (two) times daily. Pt takes 1/2 am 1/2 pm    . Multiple Vitamins-Minerals (VITEYES AREDS FORMULA) CAPS Take 1 capsule  by mouth 2 (two) times daily.    . nitroGLYCERIN (NITROSTAT) 0.4 MG SL tablet Place 1 tablet (0.4 mg total) under the tongue every 5 (five) minutes as needed. 25 tablet 6  . tiotropium (SPIRIVA HANDIHALER) 18 MCG inhalation capsule Place 18 mcg into inhaler and inhale daily.     No current facility-administered medications on file prior to visit.     Past Medical History  Diagnosis Date  . CAD (coronary artery disease)   . Renal artery atherosclerosis   . AAA (abdominal aortic aneurysm)   . Carotid artery stenosis   . History of ASCVD (atherosclerotic cardiovascular disease)   . HTN (hypertension)   . Arterial stenosis   . PVD (peripheral vascular disease)   . HLD (hyperlipidemia)   . DM (diabetes mellitus)      Past Surgical History  Procedure Laterality Date  . Femoral artery aneurysm repair  01/14/05  . Femoral-femoral bypass graft  01/14/05  . Tonsillectomy    . Basal cell carcinoma excision  1994  . Coronary artery bypass graft       Family History  Problem Relation Age of Onset  . Melanoma    . Heart disease    . Alcohol abuse    . Diabetes       History   Social History  . Marital Status: Widowed    Spouse Name: N/A    Number of Children: N/A  . Years of Education: N/A   Occupational History  . Not on file.   Social History Main  Topics  . Smoking status: Former Research scientist (life sciences)  . Smokeless tobacco: Not on file  . Alcohol Use: 0.6 oz/week    1 Glasses of wine per week  . Drug Use: No  . Sexual Activity: Not Currently   Other Topics Concern  . Not on file   Social History Narrative     ROS A 10 point review of system was performed. It is negative other than that mentioned in the history of present illness.   PHYSICAL EXAM   BP 104/60 mmHg  Pulse 64  Ht 5' 9.5" (1.765 m)  Wt 225 lb 1.9 oz (102.114 kg)  BMI 32.78 kg/m2  SpO2 98% Constitutional: He is oriented to person, place, and time. He appears well-developed and well-nourished. No distress.   HENT: No nasal discharge.  Head: Normocephalic and atraumatic.  Eyes: Pupils are equal and round.  No discharge. Neck: Normal range of motion. Neck supple. No JVD present. No thyromegaly present.  Cardiovascular: Normal rate, regular rhythm, normal heart sounds. Exam reveals no gallop and no friction rub. No murmur heard.  Pulmonary/Chest: Effort normal and breath sounds normal. No stridor. No respiratory distress. He has no wheezes. He has no rales. He exhibits no tenderness.  Abdominal: Soft. Bowel sounds are normal. He exhibits no distension. There is no tenderness. There is no rebound and no guarding.  Musculoskeletal: Normal range of motion. He exhibits no edema and no tenderness.  Neurological: He is alert and oriented to person, place, and time. Coordination normal.  Skin: Skin is warm and dry. No rash noted. He is not diaphoretic. No erythema. No pallor.  Psychiatric: He has a normal mood and affect. His behavior is normal. Judgment and thought content normal.  Vascular: Radial pulses are normal bilaterally. Femoral pulses are normal bilaterally. Distal pulses are diminished.     ASSESSMENT AND PLAN

## 2015-02-14 NOTE — CV Procedure (Addendum)
   Cardiac Catheterization Procedure Note  Name: [name] MRN: [id number] DOB: 04-Jun-1930  Procedure: Left Heart Cath, Selective Coronary Angiography, SVG and LIMA angiography  Indication: Preoperative cardiovascular evaluation with abnormal high risk nuclear stress test. Previous CABG in 1996.  Medications:  Sedation:  2 mg IV Versed, 50 mcg IV Fentanyl  Contrast:  125 ML Omnipaque   Procedural Details: The  left wrist was prepped, draped, and anesthetized with 1% lidocaine. Using the modified Seldinger technique, a 5 French sheath was introduced into the left radial artery. US guidance was used due to difficulty. 3 mg of verapamil was administered through the sheath, weight-based unfractionated heparin was administered intravenously. Standard Judkins catheters was used for selective coronary angiography.  an LCB catheter was used to engage the the left sided grafts. An IM catheter was used to engage the LIMA had a very tortuous takeoff and was not selectively engaged. Catheter exchanges were performed over an exchange length guidewire. There were no immediate procedural complications. A TR band was used for radial hemostasis at the completion of the procedure.  The patient was transferred to the post catheterization recovery area for further monitoring.  Procedural Findings:  Hemodynamics: AO:   154/70   mmHg LV:   155 over 5    mmHg LVEDP: 13  mmHg  Coronary angiography: Coronary dominance:  Right   Left Main:   large in size with 99% distal stenosis extending into the ostium of the left circumflex.  Left Anterior Descending (LAD):   occluded at the ostium.   Circumflex (LCx):   99% ostial stenosis followed by 80% proximal disease and diffuse 40-50% disease throughout .  1st obtuse marginal:   small in size.  2nd obtuse marginal:   normal in size with diffuse 90% proximal disease.  3rd obtuse marginal:   medium in size with minor irregularities.   Right Coronary Artery:   normal in size and dominant. The vessel is mildly calcified. There is 70% proximal stenosis followed by an aneurysmal segment. There is 60% mid RCA stenosis the vessel bifurcates in the midsegment into a dual system. There is retrograde flow into the SVG graft  Posterior descending artnormal in size with minor irregularities.  Posterior AV segminor irregularities.  SVG to RCA: Patent with 40% proximal disease and 50% mid disease. The graft is very degenerative.  SVG to diagonal: Occluded at the ostium. SVG to OM 3 and OM 2: There is 50% mid stenosis. There is 99% stenosis at the anastomosis. The supplied area is small to medium in size. LIMA to LAD: Patent but could not be selectively engaged.   Left ventriculography:  Was not performed.  Final Conclusions:    1. Severe three-vessel coronary artery disease with patent grafts except the SVG to diagonal which is known to be occluded (according to the patient, 1 graft was occluded in the past at the [place]). There is severe anastomosis disease and SVG to OM 2/OM 3. This is at the bifurcation of the graft retrograde and not amenable for PCI. 2. Mildly elevated left ventricular end-diastolic pressure. 3. Known ischemic cardiomyopathy with recent ejection fraction of 34%.  Recommendations:   I recommend continuing medical therapy. The patient is at moderate risk  for carotid endarterectomy. Possible evaluation for carotid stenting.   Kathlyn Sacramento MD, [facility] 02/14/2015, 1:54 PM

## 2015-02-14 NOTE — Discharge Instructions (Signed)
Radial Site Care Refer to this sheet in the next few weeks. These instructions provide you with information on caring for yourself after your procedure. Your caregiver may also give you more specific instructions. Your treatment has been planned according to current medical practices, but problems sometimes occur. Call your caregiver if you have any problems or questions after your procedure. HOME CARE INSTRUCTIONS  You may shower the day after the procedure.Remove the bandage (dressing) and gently wash the site with plain soap and water.Gently pat the site dry.  Do not apply powder or lotion to the site.  Do not submerge the affected site in water for 3 to 5 days.  Inspect the site at least twice daily.  Do not flex or bend the affected arm for 24 hours.  No lifting over 5 pounds (2.3 kg) for 5 days after your procedure.  Do not drive home if you are discharged the same day of the procedure. Have someone else drive you.  You may drive 24 hours after the procedure unless otherwise instructed by your caregiver.  Do not operate machinery or power tools for 24 hours.  A responsible adult should be with you for the first 24 hours after you arrive home. What to expect:  Any bruising will usually fade within 1 to 2 weeks.  Blood that collects in the tissue (hematoma) may be painful to the touch. It should usually decrease in size and tenderness within 1 to 2 weeks. SEEK IMMEDIATE MEDICAL CARE IF:  You have unusual pain at the radial site.  You have redness, warmth, swelling, or pain at the radial site.  You have drainage (other than a small amount of blood on the dressing).  You have chills.  You have a fever or persistent symptoms for more than 72 hours.  You have a fever and your symptoms suddenly get worse.  Your arm becomes pale, cool, tingly, or numb.  You have heavy bleeding from the site. Hold pressure on the site. Document Released: 01/03/2011 Document Revised:  02/23/2012 Document Reviewed: 01/03/2011 [facility] Patient Information 2015 [place], [place]. This information is not intended to replace advice given to you by your health care provider. Make sure you discuss any questions you have with your health care provider.   NO METFORMIN FOR 2 DAYS

## 2015-02-21 ENCOUNTER — Other Ambulatory Visit: Payer: Medicare Other

## 2015-03-06 ENCOUNTER — Encounter: Payer: Self-pay | Admitting: Vascular Surgery

## 2015-03-06 ENCOUNTER — Ambulatory Visit (INDEPENDENT_AMBULATORY_CARE_PROVIDER_SITE_OTHER): Payer: Medicare Other | Admitting: Cardiovascular Disease

## 2015-03-06 ENCOUNTER — Encounter: Payer: Self-pay | Admitting: Cardiovascular Disease

## 2015-03-06 VITALS — BP 122/58 | HR 59 | Ht 70.0 in | Wt 233.0 lb

## 2015-03-06 DIAGNOSIS — Z01810 Encounter for preprocedural cardiovascular examination: Secondary | ICD-10-CM | POA: Diagnosis not present

## 2015-03-06 DIAGNOSIS — I739 Peripheral vascular disease, unspecified: Secondary | ICD-10-CM

## 2015-03-06 NOTE — Patient Instructions (Signed)
Your physician wants you to follow-up in: 6 MONTHS with Dr Arida.  You will receive a reminder letter in the mail two months in advance. If you don't receive a letter, please call our office to schedule the follow-up appointment.  Your physician recommends that you continue on your current medications as directed. Please refer to the Current Medication list given to you today.  

## 2015-03-06 NOTE — Assessment & Plan Note (Signed)
The patient has severe asymptomatic left ICA stenosis and is being evaluated for carotid endarterectomy. He is going to see Dr. Doren Custard tomorrow. The patient has known history of coronary artery disease with recent cardiac workup showing patent grafts except for chronically occluded SVG to diagonal and anastomosis disease affecting the SVG to OM ( not amenable for PCI). Ejection fraction was 34%. Functional capacity is reasonable but reduced. Due to all of the above, the patient is considered at moderate to high risk for cardiac complications. If the surgery is felt to be relatively straightforward, he can proceed with no further workup. However, if anatomically felt to be a high-risk procedure, carotid stenting might need to be considered although the patient is asymptomatic and might need to be enrolled in a clinical trial.

## 2015-03-06 NOTE — Assessment & Plan Note (Signed)
The patient has no claudication. Aortoiliac duplex showed patent aortofemoral bypass graft and patent left to right femorofemoral bypass. There was borderline stenosis of the left external iliac artery. This should be monitored. His femoral pulses are normal.

## 2015-03-06 NOTE — Progress Notes (Signed)
Primary cardiologist:Dr. Johnsie Cancel  HPI  This is a 81 year old Caucasian male who is here today for a follow-up visit regarding peripheral vascular disease. He is status post abdominal aortic aneurysmrepair with bilateral common iliac aneurysm repair with an aorto biiliacbypass graft as well as a left aortorenal bypass in 1996. 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 He underwent left-to-right femorofemoral bypass. He has not had any claudication since then.  He has known history of coronary artery disease status post coronary artery bypass grafting in 1996 by Dr.Gerhardt.He is known to have bilateral carotid stenosis worse on the left side. No previous history of stroke. Recent carotid Doppler showed progression of his carotid disease to 60-79% on the right side and greater than 80% on the left side. In anticipation for surgery, he underwent a nuclear stress test which showed partially reversible anterolateral defect consistent with prior infarct and moderate peri-infarct ischemia. Ejection fraction was 34%. I proceeded with cardiac catheterization via the left radial artery which showed:  1. Severe three-vessel coronary artery disease with patent grafts except the SVG to diagonal which is known to be occluded. There was severe anastomosis disease and SVG to OM 2/OM 3. This is at the bifurcation of the graft retrograde and not amenable for PCI. 2. Mildly elevated left ventricular end-diastolic pressure.   Occluded vein graft to diagonal branches explained his abnormal stress test. I recommended medical therapy. He has been doing reasonably well. He has chronic exertional dyspnea with moderate activities with no chest pain.     No Known Allergies   Current Outpatient Prescriptions on File Prior to Visit  Medication Sig Dispense  Refill  . acetaminophen (TYLENOL) 325 MG tablet Take 650 mg by mouth every 6 (six) hours as needed for mild pain.    Marland Kitchen aspirin 81 MG tablet Take 81 mg by mouth daily.    Marland Kitchen atorvastatin (LIPITOR) 40 MG tablet Take 20 mg by mouth every evening. Patient taking one half tablet (20mg ) by mouth every evening    . B-D ULTRAFINE III SHORT PEN 31G X 8 MM MISC USE TO INJECT LANTUS INSULIN DAILY 100 each 0  . budesonide-formoterol (SYMBICORT) 160-4.5 MCG/ACT inhaler Inhale 2 puffs into the lungs 2 (two) times daily.    . cyanocobalamin (,VITAMIN B-12,) 1000 MCG/ML injection Inject 1,000 mcg into the muscle every 30 (thirty) days.    . enalapril (VASOTEC) 5 MG tablet Take 2.5 mg by mouth daily.    . ferrous fumarate (HEMOCYTE - 106 MG FE) 325 (106 FE) MG TABS tablet Take 0.5 tablets by mouth daily.     . insulin glargine (LANTUS) 100 UNIT/ML injection Inject 50 Units into the skin at bedtime.    . metFORMIN (GLUCOPHAGE) 1000 MG tablet Take 500 mg by mouth 2 (two) times daily with a meal. (VA CHANGED)Taking one half tablet in the morning by mouth and taking one half tablet by mouth in the evening    . metoprolol (LOPRESSOR) 50 MG tablet Take 25 mg by mouth 2 (two) times daily. Pt takes 1/2 am 1/2 pm    . Multiple Vitamins-Minerals (VITEYES AREDS FORMULA) CAPS Take 1 capsule by mouth 2 (two) times daily.    . niacin (NIASPAN) 500 MG CR tablet Take 500 mg by mouth at bedtime.    . nitroGLYCERIN (NITROSTAT) 0.4 MG SL tablet Place 1 tablet (0.4 mg total) under the tongue every 5 (  five) minutes as needed. 25 tablet 6  . tiotropium (SPIRIVA HANDIHALER) 18 MCG inhalation capsule Place 18 mcg into inhaler and inhale daily.     No current facility-administered medications on file prior to visit.     Past Medical History  Diagnosis Date  . CAD (coronary artery disease)   . Renal artery atherosclerosis   . AAA (abdominal aortic aneurysm)   . Carotid artery stenosis   . History of ASCVD (atherosclerotic  cardiovascular disease)   . HTN (hypertension)   . Arterial stenosis   . PVD (peripheral vascular disease)   . HLD (hyperlipidemia)   . DM (diabetes mellitus)      Past Surgical History  Procedure Laterality Date  . Femoral artery aneurysm repair  01/14/05  . Femoral-femoral bypass graft  01/14/05  . Tonsillectomy    . Basal cell carcinoma excision  1994  . Coronary artery bypass graft    . Left heart catheterization with coronary/graft angiogram N/A 02/14/2015    Procedure: LEFT HEART CATHETERIZATION WITH Beatrix Fetters;  Surgeon: Wellington Hampshire, MD;  Location: [facility] CATH LAB;  Service: Cardiovascular;  Laterality: N/A;     Family History  Problem Relation Age of Onset  . Melanoma    . Heart disease    . Alcohol abuse    . Diabetes       History   Social History  . Marital Status: Widowed    Spouse Name: N/A  . Number of Children: N/A  . Years of Education: N/A   Occupational History  . Not on file.   Social History Main Topics  . Smoking status: Former Research scientist (life sciences)  . Smokeless tobacco: Not on file  . Alcohol Use: 0.6 oz/week    1 Glasses of wine per week  . Drug Use: No  . Sexual Activity: Not Currently   Other Topics Concern  . Not on file   Social History Narrative     ROS A 10 point review of system was performed. It is negative other than that mentioned in the history of present illness.   PHYSICAL EXAM   BP 122/58 mmHg  Pulse 59  Ht 5\' 10"  (1.778 m)  Wt 233 lb (105.688 kg)  BMI 33.43 kg/m2  SpO2 98% Constitutional: He is oriented to person, place, and time. He appears well-developed and well-nourished. No distress.  HENT: No nasal discharge.  Head: Normocephalic and atraumatic.  Eyes: Pupils are equal and round.  No discharge. Neck: Normal range of motion. Neck supple. No JVD present. No thyromegaly present.  Cardiovascular: Normal rate, regular rhythm, normal heart sounds. Exam reveals no gallop and no friction rub. No murmur heard.    Pulmonary/Chest: Effort normal and breath sounds normal. No stridor. No respiratory distress. He has no wheezes. He has no rales. He exhibits no tenderness.  Abdominal: Soft. Bowel sounds are normal. He exhibits no distension. There is no tenderness. There is no rebound and no guarding.  Musculoskeletal: Normal range of motion. He exhibits no edema and no tenderness.  Neurological: He is alert and oriented to person, place, and time. Coordination normal.  Skin: Skin is warm and dry. No rash noted. He is not diaphoretic. No erythema. No pallor.  Psychiatric: He has a normal mood and affect. His behavior is normal. Judgment and thought content normal.  Vascular: Radial pulses are normal bilaterally. Femoral pulses are normal bilaterally. Distal pulses are diminished.     ASSESSMENT AND PLAN

## 2015-03-07 ENCOUNTER — Other Ambulatory Visit: Payer: Self-pay

## 2015-03-07 ENCOUNTER — Ambulatory Visit (HOSPITAL_COMMUNITY)
Admission: RE | Admit: 2015-03-07 | Discharge: 2015-03-07 | Disposition: A | Discharge status: Home / Self Care | Payer: Medicare Other | Source: Ambulatory Visit | Attending: Vascular Surgery | Admitting: Vascular Surgery

## 2015-03-07 ENCOUNTER — Ambulatory Visit (INDEPENDENT_AMBULATORY_CARE_PROVIDER_SITE_OTHER): Payer: Medicare Other | Admitting: Vascular Surgery

## 2015-03-07 ENCOUNTER — Encounter: Payer: Self-pay | Admitting: Vascular Surgery

## 2015-03-07 VITALS — BP 134/65 | HR 56 | Ht 70.0 in | Wt 232.0 lb

## 2015-03-07 DIAGNOSIS — I6522 Occlusion and stenosis of left carotid artery: Secondary | ICD-10-CM | POA: Diagnosis not present

## 2015-03-07 DIAGNOSIS — I6523 Occlusion and stenosis of bilateral carotid arteries: Secondary | ICD-10-CM

## 2015-03-07 NOTE — Progress Notes (Signed)
[facility] [place]  Patient name: [name] MRN: [id number] DOB: Apr 25, 1930 Sex: male  REASON FOR CONSULT: greater than 80% left carotid stenosis  HPI: [name] is a 81 y.o. male who was referred by Dr. Fletcher Anon with a greater than 80% left carotid stenosis. He is right-handed.  He states that he had a remote stroke in the past but cannot remember the details. He does have some chronic paresthesias in the right hand. He denies any recent history of stroke, TIAs, expressive or receptive aphasia, or amaurosis fugax.  He denies any recent history of chest pain or chest pressure. He does have some dyspnea on exertion. Dr. Fletcher Anon felt that the patient was moderate to high-risk for cardiac complications but that no further workup would be indicated unless the surgery appeared to be high-risk. His EF is 34%. Recent cardiac workup showed patent grafts except for a chronically occluded saphenous vein graft to the diagonal and anastomosis disease affecting the saphenous vein graft to the obtuse marginal which was not amenable to PCI.  He is on aspirin and is on a statin.   Past Medical History  Diagnosis Date  . CAD (coronary artery disease)   . Renal artery atherosclerosis   . AAA (abdominal aortic aneurysm)   . Carotid artery stenosis   . History of ASCVD (atherosclerotic cardiovascular disease)   . HTN (hypertension)   . Arterial stenosis   . PVD (peripheral vascular disease)   . HLD (hyperlipidemia)   . DM (diabetes mellitus)   . COPD (chronic obstructive pulmonary disease)    Family History  Problem Relation Age of Onset  . Melanoma    . Heart disease    . Alcohol abuse    . Diabetes     SOCIAL HISTORY: History  Substance Use Topics  . Smoking status: Former Research scientist (life sciences)  . Smokeless tobacco: Not on file  . Alcohol Use: 0.6 oz/week    1 Glasses of wine per week   No Known Allergies Current Outpatient Prescriptions  Medication Sig Dispense Refill  .  acetaminophen (TYLENOL) 325 MG tablet Take 650 mg by mouth every 6 (six) hours as needed for mild pain.    Marland Kitchen aspirin 81 MG tablet Take 81 mg by mouth daily.    Marland Kitchen atorvastatin (LIPITOR) 40 MG tablet Take 20 mg by mouth every evening. Patient taking one half tablet (20mg ) by mouth every evening    . B-D ULTRAFINE III SHORT PEN 31G X 8 MM MISC USE TO INJECT LANTUS INSULIN DAILY 100 each 0  . budesonide-formoterol (SYMBICORT) 160-4.5 MCG/ACT inhaler Inhale 2 puffs into the lungs 2 (two) times daily.    . cyanocobalamin (,VITAMIN B-12,) 1000 MCG/ML injection Inject 1,000 mcg into the muscle every 30 (thirty) days.    . enalapril (VASOTEC) 5 MG tablet Take 2.5 mg by mouth daily.    . ferrous fumarate (HEMOCYTE - 106 MG FE) 325 (106 FE) MG TABS tablet Take 0.5 tablets by mouth daily.     . insulin glargine (LANTUS) 100 UNIT/ML injection Inject 50 Units into the skin at bedtime.    . metFORMIN (GLUCOPHAGE) 1000 MG tablet Take 500 mg by mouth 2 (two) times daily with a meal. (VA CHANGED)Taking one half tablet in the morning by mouth and taking one half tablet by mouth in the evening    . metoprolol (LOPRESSOR) 50 MG tablet Take 25 mg by mouth 2 (two) times daily. Pt takes 1/2 am 1/2 pm    .  Multiple Vitamins-Minerals (VITEYES AREDS FORMULA) CAPS Take 1 capsule by mouth 2 (two) times daily.    . niacin (NIASPAN) 500 MG CR tablet Take 500 mg by mouth at bedtime.    . nitroGLYCERIN (NITROSTAT) 0.4 MG SL tablet Place 1 tablet (0.4 mg total) under the tongue every 5 (five) minutes as needed. 25 tablet 6  . tiotropium (SPIRIVA HANDIHALER) 18 MCG inhalation capsule Place 18 mcg into inhaler and inhale daily.     No current facility-administered medications for this visit.   REVIEW OF SYSTEMS: Valu.Nieves ] denotes positive finding; [  ] denotes negative finding  CARDIOVASCULAR:  [ ]  chest pain   [ ]  chest pressure   [ ]  palpitations   Valu.Nieves ] orthopnea   Valu.Nieves ] dyspnea on exertion   [ ]  claudication   [ ]  rest pain   [ ]  DVT    [ ]  phlebitis PULMONARY:   [ ]  productive cough   [ ]  asthma   [ ]  wheezing NEUROLOGIC:   [ ]  weakness  [ ]  paresthesias  [ ]  aphasia  [ ]  amaurosis  [ ]  dizziness HEMATOLOGIC:   [ ]  bleeding problems   [ ]  clotting disorders MUSCULOSKELETAL:  [ ]  joint pain   [ ]  joint swelling [ ]  leg swelling GASTROINTESTINAL: [ ]   blood in stool  [ ]   hematemesis GENITOURINARY:  [ ]   dysuria  [ ]   hematuria PSYCHIATRIC:  [ ]  history of major depression INTEGUMENTARY:  [ ]  rashes  [ ]  ulcers CONSTITUTIONAL:  [ ]  fever   [ ]  chills  PHYSICAL EXAM: Filed Vitals:   03/07/15 1038 03/07/15 1041 03/07/15 1042  BP: 145/69 129/50 134/65  Pulse: 62 56   Height: 5\' 10"  (1.778 m)    Weight: 232 lb (105.235 kg)    SpO2: 100%     Body mass index is 33.29 kg/(m^2). GENERAL: The patient is a well-nourished male, in no acute distress. The vital signs are documented above. CARDIOVASCULAR: There is a regular rate and rhythm. He has bilateral carotid bruits. PULMONARY: There is good air exchange bilaterally without wheezing or rales. ABDOMEN: Soft and non-tender with normal pitched bowel sounds.  MUSCULOSKELETAL: There are no major deformities or cyanosis. NEUROLOGIC: No focal weakness or paresthesias are detected. SKIN: There are no ulcers or rashes noted. PSYCHIATRIC: The patient has a normal affect.  DATA:  I have reviewed his carotid duplex scan from the [facility] office. Peak systolic of the proximal left internal carotid artery was 434 cm/s with an end-diastolic velocity 469 cm/s suggesting a greater than 80% stenosis. On the right side he had a 60-79% stenosis.  I have independently interpreted the carotid duplex scan in our office today which on the left side, shows a peak systolic velocity of 629 cm/s with an end-diastolic velocity of 99 cm/s. ICA to CCA ratio was 7.6. Based on the peak systolic velocity and ICA to CCA ratio I think this study confirms that there is a greater than 80% left carotid  stenosis.   MEDICAL ISSUES:  GREATER THAN 80% ASYMPTOMATIC LEFT CAROTID STENOSIS: Given the severity of the left carotid stenosis I have recommended left carotid endarterectomy. I have reviewed the indications for carotid endarterectomy, that is to lower the risk of future stroke. I have also reviewed the potential complications of surgery, including but not limited to: bleeding, stroke (perioperative risk 1-2%), MI, nerve injury of other unpredictable medical problems. All of the patients questions were answered and they  are agreeable to proceed with surgery. He knows to continue his aspirin and statin right up through surgery. His surgery is scheduled for 03/13/2015.   [place] [facility] [place] Beeper: [phone]

## 2015-03-12 ENCOUNTER — Encounter (HOSPITAL_COMMUNITY): Payer: Self-pay | Admitting: *Deleted

## 2015-03-12 MED ORDER — CEFUROXIME SODIUM 1.5 G IJ SOLR
1.5000 g | INTRAMUSCULAR | Status: AC
  Administered 2015-03-13: 1.5 g via INTRAVENOUS
  Filled 2015-03-12: qty 1.5

## 2015-03-12 MED ORDER — CHLORHEXIDINE GLUCONATE CLOTH 2 % EX PADS: 6.0000 | MEDICATED_PAD | Freq: Once | CUTANEOUS | Status: DC

## 2015-03-12 MED ORDER — SODIUM CHLORIDE 0.9 % IV SOLN: INTRAVENOUS | Status: DC

## 2015-03-12 NOTE — Progress Notes (Signed)
[name] denies chest pain, does get short of breath when walking even short distances.  Fasting CBG this am was 130, last A1C was 7.4.  Patient reported that CBG has been running a little higher due to being off of Metformin for arteriogram.

## 2015-03-12 NOTE — Progress Notes (Signed)
Anesthesia Chart Review: SAME DAY WORK-UP.  PAT scheduling reports he was a late add on, and patient did not return phone calls from 03/08/15 or 03/12/15 early AM until late this morning.  He reported having phone issues and that he was 90 minutes away, so he could not make it to a PAT appointment today.  Patient is a 81 year old male scheduled for left CEA on 03/13/15 by Dr. Scot Dock.  He was recently found to have > 60% LICA stenosis and 10-93% RICA stenosis. He is asymptomatic of his carotid disease.  History includes former smoker, DM2, carotid artery disease, HTN, HLD, gastric ulcers, PAD and AAA s/p AFBG with aortobi-iliac graft and left aorto-renal bypass '96 (now occluded with atrophic left kidney) and left to right FFBG, CAD s/p CABG '96, chronic paresthesia of the right hand. OSA screening score is 4 or greater.  PCP is listed as Dr. Jill Alexanders. He also receives care thru the [facility].   By notes, it appears primary cardiologist has been Dr. Johnsie Cancel, but he referred patient to cardiologist is Dr. Fletcher Anon for further evaluation of his PAD.  Dr. Fletcher Anon ordered a stress test which was abnormal, and he ultimately had a cardiac cath (see below).  Dr. Fletcher Anon felt patient was moderate to high risk for cardiac complications.  He felt that if Dr. Scot Dock felt anatomically the case seemed straightforward that he could proceed without further work-up; however, if anatomically high risk then may need to be considered for carotid stent.   Meds include ASA, Lipitor, Symbicort, enalapril, ferrous fumarate, Lantus, metformin, Niaspan, Nitro, Prilosec, Spiriva.  02/14/15 EKG: SR with first degree AVB with PACs, LVH with repolarization abnormality versus consider anterolateral ischemia, inferior infarct (age undetermined).   02/05/15 Nuclear stress test:Overall Impression: High risk stress nuclear study with a large, severe, partially reversible anterior lateral defect consistent with prior infarct and moderaete peri-infarct  ischemia. LV Ejection Fraction: 34%. LV Wall Motion: Global hypokinesis and akinesis of the anterior lateral wall. This lead to a cardiac cath (see below).  02/14/15 Cardiac cath: Coronary dominance: Right - Left Main: large in size with 99% distal stenosis extending into the ostium of the left circumflex. - Left Anterior Descending (LAD): occluded at the ostium. - Circumflex (LCx): 99% ostial stenosis followed by 80% proximal disease and diffuse 40-50% disease throughout . - 1st obtuse marginal: small in size. - 2nd obtuse marginal: normal in size with diffuse 90% proximal disease. - 3rd obtuse marginal: medium in size with minor irregularities. - Right Coronary Artery: normal in size and dominant. The vessel is mildly calcified. There is 70% proximal stenosis followed by an aneurysmal segment. There is 60% mid RCA stenosis the vessel bifurcates in the midsegment into a dual system. There is retrograde flow into the SVG graft - Posterior descending artnormal in size with minor irregularities. - Posterior AV segminor irregularities. - SVG to RCA: Patent with 40% proximal disease and 50% mid disease. The graft is very degenerative. - SVG to diagonal: Occluded at the ostium. - SVG to OM 3 and OM 2: There is 50% mid stenosis. There is 99% stenosis at the anastomosis. The supplied area is small to medium in size. - LIMA to LAD: Patent but could not be selectively engaged. - Left ventriculography: Was not performed. Final Conclusions:  1. Severe three-vessel coronary artery disease with patent grafts except the SVG to diagonal which is known to be occluded (according to the patient, 1 [facility] There is severe anastomosis disease and SVG to OM 2/OM 3. This is at the bifurcation of the graft retrograde and not amenable for PCI. 2. Mildly elevated left ventricular end-diastolic pressure. 3. Known ischemic cardiomyopathy with recent ejection  fraction of 34%. Recommendations: I recommend continuing medical therapy. The patient is at moderate risk for carotid endarterectomy. Possible evaluation for carotid stenting (Dr. Fletcher Anon).   He will need labs on arrival. Patient reported his fasting glucose this AM was 130.   Leondro Hugh [facility] Short Stay [facility]/Anesthesiology Phone [phone] 03/12/2015 11:34 AM

## 2015-03-12 NOTE — Anesthesia Preprocedure Evaluation (Addendum)
Anesthesia Evaluation  Patient identified by MRN, date of birth, ID band Patient awake    Reviewed: Allergy & Precautions, NPO status , Patient's Chart, lab work & pertinent test results, reviewed documented beta blocker date and time   Airway Mallampati: II  TM Distance: >3 FB Neck ROM: Full    Dental no notable dental hx. (+) Chipped, Dental Advisory Given Front upper right tooth chipped:   Pulmonary shortness of breath, COPDformer smoker,  breath sounds clear to auscultation  Pulmonary exam normal       Cardiovascular hypertension, Pt. on medications and Pt. on home beta blockers + CAD, + CABG and + Peripheral Vascular Disease Rhythm:Regular Rate:Normal     Neuro/Psych negative neurological ROS  negative psych ROS   GI/Hepatic Neg liver ROS, PUD, GERD-  ,  Endo/Other  diabetes, Type 2, Oral Hypoglycemic Agents, Insulin Dependent  Renal/GU Renal disease     Musculoskeletal negative musculoskeletal ROS (+)   Abdominal   Peds  Hematology negative hematology ROS (+)   Anesthesia Other Findings   Reproductive/Obstetrics                          Anesthesia Physical Anesthesia Plan  ASA: IV  Anesthesia Plan: General   Post-op Pain Management:    Induction: Intravenous  Airway Management Planned: Oral ETT  Additional Equipment: Arterial line  Intra-op Plan:   Post-operative Plan: Extubation in OR  Informed Consent: I have reviewed the patients History and Physical, chart, labs and discussed the procedure including the risks, benefits and alternatives for the proposed anesthesia with the patient or authorized representative who has indicated his/her understanding and acceptance.   Dental advisory given  Plan Discussed with: CRNA  Anesthesia Plan Comments: (Remifentanil infusion)        Anesthesia Quick Evaluation

## 2015-03-12 NOTE — Progress Notes (Signed)
   03/12/15 0957  OBSTRUCTIVE SLEEP APNEA  Have you ever been diagnosed with sleep apnea through a sleep study? No  Do you snore loudly (loud enough to be heard through closed doors)?  0  Do you often feel tired, fatigued, or sleepy during the daytime? 1  Has anyone observed you stop breathing during your sleep? 0  Do you have, or are you being treated for high blood pressure? 1  BMI more than 35 kg/m2? 0  Age over 79 years old? 1  Neck circumference greater than 40 cm/16 inches? 1 (18)  Gender: 1

## 2015-03-13 ENCOUNTER — Inpatient Hospital Stay (HOSPITAL_COMMUNITY)
Admission: RE | Admit: 2015-03-13 | Discharge: 2015-03-14 | DRG: 038 | Disposition: A | Discharge status: Home / Self Care | Payer: Medicare Other | Source: Ambulatory Visit | Attending: Vascular Surgery | Admitting: Vascular Surgery

## 2015-03-13 ENCOUNTER — Telehealth: Payer: Self-pay | Admitting: Vascular Surgery

## 2015-03-13 ENCOUNTER — Encounter (HOSPITAL_COMMUNITY)
Admission: RE | Disposition: A | Discharge status: Home / Self Care | Payer: Self-pay | Source: Ambulatory Visit | Attending: Vascular Surgery

## 2015-03-13 ENCOUNTER — Inpatient Hospital Stay (HOSPITAL_COMMUNITY): Payer: Medicare Other | Admitting: Vascular Surgery

## 2015-03-13 DIAGNOSIS — I739 Peripheral vascular disease, unspecified: Secondary | ICD-10-CM | POA: Diagnosis not present

## 2015-03-13 DIAGNOSIS — I25810 Atherosclerosis of coronary artery bypass graft(s) without angina pectoris: Secondary | ICD-10-CM | POA: Diagnosis not present

## 2015-03-13 DIAGNOSIS — R0609 Other forms of dyspnea: Secondary | ICD-10-CM | POA: Diagnosis present

## 2015-03-13 DIAGNOSIS — E669 Obesity, unspecified: Secondary | ICD-10-CM | POA: Diagnosis not present

## 2015-03-13 DIAGNOSIS — Z7982 Long term (current) use of aspirin: Secondary | ICD-10-CM | POA: Diagnosis not present

## 2015-03-13 DIAGNOSIS — J449 Chronic obstructive pulmonary disease, unspecified: Secondary | ICD-10-CM | POA: Diagnosis not present

## 2015-03-13 DIAGNOSIS — I659 Occlusion and stenosis of unspecified precerebral artery: Secondary | ICD-10-CM | POA: Diagnosis not present

## 2015-03-13 DIAGNOSIS — Z87891 Personal history of nicotine dependence: Secondary | ICD-10-CM

## 2015-03-13 DIAGNOSIS — R202 Paresthesia of skin: Secondary | ICD-10-CM | POA: Diagnosis present

## 2015-03-13 DIAGNOSIS — Z79899 Other long term (current) drug therapy: Secondary | ICD-10-CM | POA: Diagnosis not present

## 2015-03-13 DIAGNOSIS — Z833 Family history of diabetes mellitus: Secondary | ICD-10-CM

## 2015-03-13 DIAGNOSIS — E1140 Type 2 diabetes mellitus with diabetic neuropathy, unspecified: Secondary | ICD-10-CM | POA: Diagnosis not present

## 2015-03-13 DIAGNOSIS — Z8711 Personal history of peptic ulcer disease: Secondary | ICD-10-CM

## 2015-03-13 DIAGNOSIS — E785 Hyperlipidemia, unspecified: Secondary | ICD-10-CM | POA: Diagnosis present

## 2015-03-13 DIAGNOSIS — I714 Abdominal aortic aneurysm, without rupture: Secondary | ICD-10-CM | POA: Diagnosis not present

## 2015-03-13 DIAGNOSIS — Z8673 Personal history of transient ischemic attack (TIA), and cerebral infarction without residual deficits: Secondary | ICD-10-CM | POA: Diagnosis not present

## 2015-03-13 DIAGNOSIS — Z794 Long term (current) use of insulin: Secondary | ICD-10-CM

## 2015-03-13 DIAGNOSIS — I701 Atherosclerosis of renal artery: Secondary | ICD-10-CM | POA: Diagnosis present

## 2015-03-13 DIAGNOSIS — I959 Hypotension, unspecified: Secondary | ICD-10-CM | POA: Diagnosis present

## 2015-03-13 DIAGNOSIS — Z8249 Family history of ischemic heart disease and other diseases of the circulatory system: Secondary | ICD-10-CM

## 2015-03-13 DIAGNOSIS — I6522 Occlusion and stenosis of left carotid artery: Principal | ICD-10-CM | POA: Diagnosis present

## 2015-03-13 DIAGNOSIS — K219 Gastro-esophageal reflux disease without esophagitis: Secondary | ICD-10-CM | POA: Diagnosis not present

## 2015-03-13 DIAGNOSIS — I10 Essential (primary) hypertension: Secondary | ICD-10-CM | POA: Diagnosis not present

## 2015-03-13 DIAGNOSIS — Z7901 Long term (current) use of anticoagulants: Secondary | ICD-10-CM

## 2015-03-13 DIAGNOSIS — Z951 Presence of aortocoronary bypass graft: Secondary | ICD-10-CM

## 2015-03-13 DIAGNOSIS — I2510 Atherosclerotic heart disease of native coronary artery without angina pectoris: Secondary | ICD-10-CM | POA: Diagnosis not present

## 2015-03-13 HISTORY — PX: ENDARTERECTOMY: SHX5162

## 2015-03-13 HISTORY — PX: PATCH ANGIOPLASTY: SHX6230

## 2015-03-13 HISTORY — DX: Gastric ulcer, unspecified as acute or chronic, without hemorrhage or perforation: K25.9

## 2015-03-13 HISTORY — DX: Gastro-esophageal reflux disease without esophagitis: K21.9

## 2015-03-13 HISTORY — DX: Anesthesia of skin: R20.0

## 2015-03-13 HISTORY — DX: Reserved for inherently not codable concepts without codable children: IMO0001

## 2015-03-13 LAB — CBC
HCT: 34.9 % — ABNORMAL LOW (ref 39.0–52.0)
HEMATOCRIT: 37.6 % — AB (ref 39.0–52.0)
HEMOGLOBIN: 11.5 g/dL — AB (ref 13.0–17.0)
Hemoglobin: 12.4 g/dL — ABNORMAL LOW (ref 13.0–17.0)
MCH: 32 pg (ref 26.0–34.0)
MCH: 31.7 pg (ref 26.0–34.0)
MCHC: 33 g/dL (ref 30.0–36.0)
MCHC: 33 g/dL (ref 30.0–36.0)
MCV: 97.2 fL (ref 78.0–100.0)
MCV: 96.1 fL (ref 78.0–100.0)
PLATELETS: 160 10*3/uL (ref 150–400)
PLATELETS: 146 10*3/uL — AB (ref 150–400)
RBC: 3.87 MIL/uL — AB (ref 4.22–5.81)
RBC: 3.63 MIL/uL — AB (ref 4.22–5.81)
RDW: 14.2 % (ref 11.5–15.5)
RDW: 13.9 % (ref 11.5–15.5)
WBC: 10.5 10*3/uL (ref 4.0–10.5)
WBC: 15.3 10*3/uL — ABNORMAL HIGH (ref 4.0–10.5)

## 2015-03-13 LAB — URINALYSIS, ROUTINE W REFLEX MICROSCOPIC
Bilirubin Urine: NEGATIVE
GLUCOSE, UA: 250 mg/dL — AB
Hgb urine dipstick: NEGATIVE
Ketones, ur: NEGATIVE mg/dL
Leukocytes, UA: NEGATIVE
Nitrite: NEGATIVE
PROTEIN: 30 mg/dL — AB
Specific Gravity, Urine: 1.016 (ref 1.005–1.030)
Urobilinogen, UA: 1 mg/dL (ref 0.0–1.0)
pH: 5.5 (ref 5.0–8.0)

## 2015-03-13 LAB — COMPREHENSIVE METABOLIC PANEL
ALT: 13 U/L (ref 0–53)
ANION GAP: 9 (ref 5–15)
AST: 16 U/L (ref 0–37)
Albumin: 3.4 g/dL — ABNORMAL LOW (ref 3.5–5.2)
Alkaline Phosphatase: 68 U/L (ref 39–117)
BILIRUBIN TOTAL: 0.5 mg/dL (ref 0.3–1.2)
BUN: 29 mg/dL — ABNORMAL HIGH (ref 6–23)
CALCIUM: 9 mg/dL (ref 8.4–10.5)
CHLORIDE: 99 mmol/L (ref 96–112)
CO2: 25 mmol/L (ref 19–32)
CREATININE: 1.39 mg/dL — AB (ref 0.50–1.35)
GFR calc non Af Amer: 45 mL/min — ABNORMAL LOW (ref >90)
GFR, EST AFRICAN AMERICAN: 52 mL/min — AB (ref >90)
Glucose, Bld: 204 mg/dL — ABNORMAL HIGH (ref 70–99)
Potassium: 4.4 mmol/L (ref 3.5–5.1)
SODIUM: 133 mmol/L — AB (ref 135–145)
TOTAL PROTEIN: 6.5 g/dL (ref 6.0–8.3)

## 2015-03-13 LAB — URINE MICROSCOPIC-ADD ON

## 2015-03-13 LAB — GLUCOSE, CAPILLARY
GLUCOSE-CAPILLARY: 203 mg/dL — AB (ref 70–99)
Glucose-Capillary: 224 mg/dL — ABNORMAL HIGH (ref 70–99)

## 2015-03-13 LAB — TYPE AND SCREEN
ABO/RH(D): A POS
Antibody Screen: NEGATIVE

## 2015-03-13 LAB — PROTIME-INR
INR: 1.02 (ref 0.00–1.49)
Prothrombin Time: 13.5 seconds (ref 11.6–15.2)

## 2015-03-13 LAB — ABO/RH: ABO/RH(D): A POS

## 2015-03-13 LAB — CREATININE, SERUM
Creatinine, Ser: 1.25 mg/dL (ref 0.50–1.35)
GFR calc Af Amer: 60 mL/min — ABNORMAL LOW (ref >90)
GFR, EST NON AFRICAN AMERICAN: 51 mL/min — AB (ref >90)

## 2015-03-13 LAB — APTT: aPTT: 29 seconds (ref 24–37)

## 2015-03-13 SURGERY — ENDARTERECTOMY, CAROTID
Anesthesia: General | Site: Neck | Laterality: Left

## 2015-03-13 MED ORDER — PHENOL 1.4 % MT LIQD: 1.0000 | OROMUCOSAL | Status: DC | PRN

## 2015-03-13 MED ORDER — OXYCODONE HCL 5 MG PO TABS
5.0000 mg | ORAL_TABLET | Freq: Four times a day (QID) | ORAL | Status: DC | PRN
  Administered 2015-03-13 – 2015-03-14 (×3): 5 mg via ORAL
  Filled 2015-03-13 (×3): qty 1

## 2015-03-13 MED ORDER — OXYCODONE HCL 5 MG PO TABS: 5.0000 mg | ORAL_TABLET | Freq: Four times a day (QID) | ORAL | Status: DC | PRN

## 2015-03-13 MED ORDER — PANTOPRAZOLE SODIUM 40 MG PO TBEC
40.0000 mg | DELAYED_RELEASE_TABLET | Freq: Every day | ORAL | Status: DC
  Administered 2015-03-13 – 2015-03-14 (×2): 40 mg via ORAL
  Filled 2015-03-13 (×2): qty 1

## 2015-03-13 MED ORDER — FENTANYL CITRATE 0.05 MG/ML IJ SOLN
INTRAMUSCULAR | Status: DC | PRN
  Administered 2015-03-13 (×2): 25 ug via INTRAVENOUS
  Administered 2015-03-13: 50 ug via INTRAVENOUS

## 2015-03-13 MED ORDER — MAGNESIUM HYDROXIDE 400 MG/5ML PO SUSP: 30.0000 mL | Freq: Every day | ORAL | Status: DC | PRN

## 2015-03-13 MED ORDER — ATORVASTATIN CALCIUM 20 MG PO TABS
20.0000 mg | ORAL_TABLET | Freq: Every evening | ORAL | Status: DC
  Administered 2015-03-13: 20 mg via ORAL
  Filled 2015-03-13 (×2): qty 1

## 2015-03-13 MED ORDER — LIDOCAINE-EPINEPHRINE (PF) 1 %-1:200000 IJ SOLN
INTRAMUSCULAR | Status: AC
  Filled 2015-03-13: qty 10

## 2015-03-13 MED ORDER — METOPROLOL TARTRATE 25 MG PO TABS
25.0000 mg | ORAL_TABLET | Freq: Two times a day (BID) | ORAL | Status: DC
  Filled 2015-03-13 (×3): qty 1

## 2015-03-13 MED ORDER — LIDOCAINE HCL (CARDIAC) 20 MG/ML IV SOLN
INTRAVENOUS | Status: AC
  Filled 2015-03-13: qty 5

## 2015-03-13 MED ORDER — ALUM & MAG HYDROXIDE-SIMETH 200-200-20 MG/5ML PO SUSP: 15.0000 mL | ORAL | Status: DC | PRN

## 2015-03-13 MED ORDER — OCUVITE-LUTEIN PO CAPS
ORAL_CAPSULE | Freq: Two times a day (BID) | ORAL | Status: DC
  Administered 2015-03-13 – 2015-03-14 (×3): 1 via ORAL
  Filled 2015-03-13 (×5): qty 1

## 2015-03-13 MED ORDER — GLYCOPYRROLATE 0.2 MG/ML IJ SOLN
INTRAMUSCULAR | Status: AC
  Filled 2015-03-13: qty 2

## 2015-03-13 MED ORDER — PHENYLEPHRINE HCL 10 MG/ML IJ SOLN
10.0000 mg | INTRAVENOUS | Status: DC | PRN
  Administered 2015-03-13: 75 ug/min via INTRAVENOUS
  Administered 2015-03-13: 15 ug/min via INTRAVENOUS

## 2015-03-13 MED ORDER — ROCURONIUM BROMIDE 100 MG/10ML IV SOLN
INTRAVENOUS | Status: DC | PRN
  Administered 2015-03-13: 40 mg via INTRAVENOUS

## 2015-03-13 MED ORDER — NIACIN ER (ANTIHYPERLIPIDEMIC) 500 MG PO TBCR
500.0000 mg | EXTENDED_RELEASE_TABLET | Freq: Every day | ORAL | Status: DC
  Administered 2015-03-14: 500 mg via ORAL
  Filled 2015-03-13 (×2): qty 1

## 2015-03-13 MED ORDER — ASPIRIN EC 81 MG PO TBEC
81.0000 mg | DELAYED_RELEASE_TABLET | Freq: Every day | ORAL | Status: DC
  Administered 2015-03-13 – 2015-03-14 (×2): 81 mg via ORAL
  Filled 2015-03-13 (×2): qty 1

## 2015-03-13 MED ORDER — FERROUS FUMARATE 325 (106 FE) MG PO TABS
106.0000 mg | ORAL_TABLET | Freq: Every day | ORAL | Status: DC
  Administered 2015-03-13 – 2015-03-14 (×2): 106 mg via ORAL
  Filled 2015-03-13 (×2): qty 1

## 2015-03-13 MED ORDER — ROCURONIUM BROMIDE 50 MG/5ML IV SOLN
INTRAVENOUS | Status: AC
  Filled 2015-03-13: qty 1

## 2015-03-13 MED ORDER — SODIUM CHLORIDE 0.9 % IV SOLN
INTRAVENOUS | Status: DC
  Administered 2015-03-13: 50 mL via INTRAVENOUS

## 2015-03-13 MED ORDER — PROTAMINE SULFATE 10 MG/ML IV SOLN
INTRAVENOUS | Status: DC | PRN
  Administered 2015-03-13: 40 mg via INTRAVENOUS

## 2015-03-13 MED ORDER — TIOTROPIUM BROMIDE MONOHYDRATE 18 MCG IN CAPS
18.0000 ug | ORAL_CAPSULE | Freq: Every day | RESPIRATORY_TRACT | Status: DC
  Administered 2015-03-14: 18 ug via RESPIRATORY_TRACT
  Filled 2015-03-13 (×2): qty 5

## 2015-03-13 MED ORDER — METFORMIN HCL 500 MG PO TABS
500.0000 mg | ORAL_TABLET | Freq: Two times a day (BID) | ORAL | Status: DC
  Administered 2015-03-14: 500 mg via ORAL
  Filled 2015-03-13 (×4): qty 1

## 2015-03-13 MED ORDER — HYDRALAZINE HCL 20 MG/ML IJ SOLN: 5.0000 mg | INTRAMUSCULAR | Status: DC | PRN

## 2015-03-13 MED ORDER — SODIUM CHLORIDE 0.9 % IV SOLN
0.0125 ug/kg/min | INTRAVENOUS | Status: DC
  Filled 2015-03-13: qty 2000

## 2015-03-13 MED ORDER — FENTANYL CITRATE 0.05 MG/ML IJ SOLN: 25.0000 ug | INTRAMUSCULAR | Status: DC | PRN

## 2015-03-13 MED ORDER — SODIUM CHLORIDE 0.9 % IR SOLN
Status: DC | PRN
  Administered 2015-03-13: 500 mL

## 2015-03-13 MED ORDER — BUDESONIDE-FORMOTEROL FUMARATE 160-4.5 MCG/ACT IN AERO
2.0000 | INHALATION_SPRAY | Freq: Two times a day (BID) | RESPIRATORY_TRACT | Status: DC
Start: 2015-03-13 — End: 2015-03-14
  Administered 2015-03-13 – 2015-03-14 (×2): 2 via RESPIRATORY_TRACT
  Filled 2015-03-13 (×2): qty 6

## 2015-03-13 MED ORDER — SODIUM CHLORIDE 0.9 % IJ SOLN
INTRAMUSCULAR | Status: AC
  Filled 2015-03-13: qty 10

## 2015-03-13 MED ORDER — DEXTRAN 40 IN SALINE 10-0.9 % IV SOLN
INTRAVENOUS | Status: DC | PRN
  Administered 2015-03-13: 500 mL

## 2015-03-13 MED ORDER — HEPARIN SODIUM (PORCINE) 1000 UNIT/ML IJ SOLN
INTRAMUSCULAR | Status: DC | PRN
  Administered 2015-03-13: 9000 [IU] via INTRAVENOUS

## 2015-03-13 MED ORDER — ONDANSETRON HCL 4 MG/2ML IJ SOLN
INTRAMUSCULAR | Status: DC | PRN
  Administered 2015-03-13: 4 mg via INTRAVENOUS

## 2015-03-13 MED ORDER — DOPAMINE-DEXTROSE 3.2-5 MG/ML-% IV SOLN
INTRAVENOUS | Status: AC
  Filled 2015-03-13: qty 250

## 2015-03-13 MED ORDER — DEXTRAN 40 IN SALINE 10-0.9 % IV SOLN
INTRAVENOUS | Status: AC
  Filled 2015-03-13: qty 500

## 2015-03-13 MED ORDER — METOPROLOL TARTRATE 1 MG/ML IV SOLN
2.0000 mg | INTRAVENOUS | Status: DC | PRN
Start: 2015-03-13 — End: 2015-03-14

## 2015-03-13 MED ORDER — EPHEDRINE SULFATE 50 MG/ML IJ SOLN
INTRAMUSCULAR | Status: AC
  Filled 2015-03-13: qty 1

## 2015-03-13 MED ORDER — HEPARIN SODIUM (PORCINE) 1000 UNIT/ML IJ SOLN
INTRAMUSCULAR | Status: AC
  Filled 2015-03-13: qty 1

## 2015-03-13 MED ORDER — NEOSTIGMINE METHYLSULFATE 10 MG/10ML IV SOLN
INTRAVENOUS | Status: DC | PRN
  Administered 2015-03-13: 3 mg via INTRAVENOUS

## 2015-03-13 MED ORDER — MEPERIDINE HCL 25 MG/ML IJ SOLN: 6.2500 mg | INTRAMUSCULAR | Status: DC | PRN

## 2015-03-13 MED ORDER — ONDANSETRON HCL 4 MG/2ML IJ SOLN
4.0000 mg | Freq: Four times a day (QID) | INTRAMUSCULAR | Status: DC | PRN
  Filled 2015-03-13: qty 2

## 2015-03-13 MED ORDER — ARTIFICIAL TEARS OP OINT
TOPICAL_OINTMENT | OPHTHALMIC | Status: AC
  Filled 2015-03-13: qty 3.5

## 2015-03-13 MED ORDER — PROMETHAZINE HCL 25 MG/ML IJ SOLN: 6.2500 mg | INTRAMUSCULAR | Status: DC | PRN

## 2015-03-13 MED ORDER — THROMBIN 20000 UNITS EX SOLR
CUTANEOUS | Status: AC
  Filled 2015-03-13: qty 20000

## 2015-03-13 MED ORDER — BISACODYL 10 MG RE SUPP: 10.0000 mg | Freq: Every day | RECTAL | Status: DC | PRN

## 2015-03-13 MED ORDER — PHENYLEPHRINE HCL 10 MG/ML IJ SOLN
INTRAMUSCULAR | Status: DC | PRN
  Administered 2015-03-13: 40 ug via INTRAVENOUS

## 2015-03-13 MED ORDER — FENTANYL CITRATE 0.05 MG/ML IJ SOLN
INTRAMUSCULAR | Status: AC
  Filled 2015-03-13: qty 5

## 2015-03-13 MED ORDER — SODIUM CHLORIDE 0.9 % IV SOLN
2000.0000 ug | INTRAVENOUS | Status: DC | PRN
  Administered 2015-03-13: .05 ug/kg/min via INTRAVENOUS

## 2015-03-13 MED ORDER — LIDOCAINE HCL (PF) 1 % IJ SOLN
INTRAMUSCULAR | Status: AC
  Filled 2015-03-13: qty 30

## 2015-03-13 MED ORDER — POTASSIUM CHLORIDE CRYS ER 20 MEQ PO TBCR
20.0000 meq | EXTENDED_RELEASE_TABLET | Freq: Every day | ORAL | Status: DC | PRN
Start: 2015-03-13 — End: 2015-03-14

## 2015-03-13 MED ORDER — INSULIN GLARGINE 100 UNIT/ML ~~LOC~~ SOLN
50.0000 [IU] | Freq: Every day | SUBCUTANEOUS | Status: DC
  Administered 2015-03-14: 50 [IU] via SUBCUTANEOUS
  Filled 2015-03-13 (×2): qty 0.5

## 2015-03-13 MED ORDER — DEXTROSE 5 % IV SOLN
1.5000 g | Freq: Two times a day (BID) | INTRAVENOUS | Status: AC
  Administered 2015-03-14 (×2): 1.5 g via INTRAVENOUS
  Filled 2015-03-13 (×2): qty 1.5

## 2015-03-13 MED ORDER — NITROGLYCERIN 0.4 MG SL SUBL: 0.4000 mg | SUBLINGUAL_TABLET | SUBLINGUAL | Status: DC | PRN

## 2015-03-13 MED ORDER — LACTATED RINGERS IV SOLN
INTRAVENOUS | Status: DC | PRN
  Administered 2015-03-13 (×2): via INTRAVENOUS

## 2015-03-13 MED ORDER — ONDANSETRON HCL 4 MG/2ML IJ SOLN
INTRAMUSCULAR | Status: AC
  Filled 2015-03-13: qty 2

## 2015-03-13 MED ORDER — LIDOCAINE-EPINEPHRINE (PF) 1 %-1:200000 IJ SOLN
INTRAMUSCULAR | Status: DC | PRN
  Administered 2015-03-13: 10 mL

## 2015-03-13 MED ORDER — DOCUSATE SODIUM 100 MG PO CAPS
100.0000 mg | ORAL_CAPSULE | Freq: Every day | ORAL | Status: DC
  Administered 2015-03-14: 100 mg via ORAL
  Filled 2015-03-13: qty 1

## 2015-03-13 MED ORDER — MORPHINE SULFATE 2 MG/ML IJ SOLN
2.0000 mg | INTRAMUSCULAR | Status: DC | PRN
  Administered 2015-03-13: 2 mg via INTRAVENOUS

## 2015-03-13 MED ORDER — PROPOFOL 10 MG/ML IV BOLUS
INTRAVENOUS | Status: AC
  Filled 2015-03-13: qty 20

## 2015-03-13 MED ORDER — GLYCOPYRROLATE 0.2 MG/ML IJ SOLN
INTRAMUSCULAR | Status: DC | PRN
  Administered 2015-03-13: 0.4 mg via INTRAVENOUS

## 2015-03-13 MED ORDER — LABETALOL HCL 5 MG/ML IV SOLN: 10.0000 mg | INTRAVENOUS | Status: DC | PRN

## 2015-03-13 MED ORDER — GUAIFENESIN-DM 100-10 MG/5ML PO SYRP: 15.0000 mL | ORAL_SOLUTION | ORAL | Status: DC | PRN

## 2015-03-13 MED ORDER — ENOXAPARIN SODIUM 30 MG/0.3ML ~~LOC~~ SOLN
30.0000 mg | SUBCUTANEOUS | Status: DC
  Administered 2015-03-14: 30 mg via SUBCUTANEOUS
  Filled 2015-03-13: qty 0.3

## 2015-03-13 MED ORDER — CYANOCOBALAMIN 1000 MCG/ML IJ SOLN: 1000.0000 ug | INTRAMUSCULAR | Status: DC

## 2015-03-13 MED ORDER — LIDOCAINE HCL (CARDIAC) 20 MG/ML IV SOLN
INTRAVENOUS | Status: DC | PRN
  Administered 2015-03-13: 60 mg via INTRAVENOUS

## 2015-03-13 MED ORDER — ENALAPRIL MALEATE 2.5 MG PO TABS
2.5000 mg | ORAL_TABLET | Freq: Every day | ORAL | Status: DC
  Filled 2015-03-13: qty 1

## 2015-03-13 MED ORDER — ARTIFICIAL TEARS OP OINT
TOPICAL_OINTMENT | OPHTHALMIC | Status: DC | PRN
  Administered 2015-03-13: 1 via OPHTHALMIC

## 2015-03-13 MED ORDER — PROTAMINE SULFATE 10 MG/ML IV SOLN
INTRAVENOUS | Status: AC
  Filled 2015-03-13: qty 5

## 2015-03-13 MED ORDER — NEOSTIGMINE METHYLSULFATE 10 MG/10ML IV SOLN
INTRAVENOUS | Status: AC
  Filled 2015-03-13: qty 1

## 2015-03-13 MED ORDER — MORPHINE SULFATE 2 MG/ML IJ SOLN
INTRAMUSCULAR | Status: AC
  Filled 2015-03-13: qty 1

## 2015-03-13 MED ORDER — LIDOCAINE HCL 4 % MT SOLN
OROMUCOSAL | Status: DC | PRN
  Administered 2015-03-13: 4 mL via TOPICAL

## 2015-03-13 MED ORDER — SODIUM CHLORIDE 0.9 % IV SOLN: 500.0000 mL | Freq: Once | INTRAVENOUS | Status: AC | PRN

## 2015-03-13 MED ORDER — DOPAMINE-DEXTROSE 3.2-5 MG/ML-% IV SOLN
3.0000 ug/kg/min | INTRAVENOUS | Status: DC
  Administered 2015-03-13: 3 ug/kg/min via INTRAVENOUS

## 2015-03-13 MED ORDER — SUCCINYLCHOLINE CHLORIDE 20 MG/ML IJ SOLN
INTRAMUSCULAR | Status: AC
  Filled 2015-03-13: qty 1

## 2015-03-13 MED ORDER — 0.9 % SODIUM CHLORIDE (POUR BTL) OPTIME
TOPICAL | Status: DC | PRN
  Administered 2015-03-13 (×2): 1000 mL

## 2015-03-13 MED ORDER — PROPOFOL 10 MG/ML IV BOLUS
INTRAVENOUS | Status: DC | PRN
  Administered 2015-03-13: 150 mg via INTRAVENOUS

## 2015-03-13 MED ORDER — ACETAMINOPHEN 325 MG PO TABS
650.0000 mg | ORAL_TABLET | Freq: Four times a day (QID) | ORAL | Status: DC | PRN
  Administered 2015-03-13: 325 mg via ORAL
  Administered 2015-03-14 (×2): 650 mg via ORAL
  Filled 2015-03-13 (×3): qty 2

## 2015-03-13 MED ORDER — MAGNESIUM SULFATE 2 GM/50ML IV SOLN: 2.0000 g | Freq: Every day | INTRAVENOUS | Status: DC | PRN

## 2015-03-13 SURGICAL SUPPLY — 46 items
BAG DECANTER FOR FLEXI CONT (MISCELLANEOUS) ×3 IMPLANT
CANISTER SUCTION 2500CC (MISCELLANEOUS) ×3 IMPLANT
CANNULA VESSEL 3MM 2 BLNT TIP (CANNULA) ×3 IMPLANT
CATH ROBINSON RED A/P 18FR (CATHETERS) ×3 IMPLANT
CLIP TI MEDIUM 24 (CLIP) ×3 IMPLANT
CLIP TI WIDE RED SMALL 24 (CLIP) ×6 IMPLANT
CRADLE DONUT ADULT HEAD (MISCELLANEOUS) ×3 IMPLANT
DRAIN CHANNEL 15F RND FF W/TCR (WOUND CARE) IMPLANT
ELECT REM PT RETURN 9FT ADLT (ELECTROSURGICAL) ×3
ELECTRODE REM PT RTRN 9FT ADLT (ELECTROSURGICAL) ×1 IMPLANT
EVACUATOR SILICONE 100CC (DRAIN) IMPLANT
GEL ULTRASOUND 20GR AQUASONIC (MISCELLANEOUS) ×3 IMPLANT
GLOVE BIO SURGEON STRL SZ 6.5 (GLOVE) ×4 IMPLANT
GLOVE BIO SURGEON STRL SZ7 (GLOVE) ×3 IMPLANT
GLOVE BIO SURGEON STRL SZ7.5 (GLOVE) ×3 IMPLANT
GLOVE BIO SURGEONS STRL SZ 6.5 (GLOVE) ×2
GLOVE BIOGEL PI IND STRL 7.5 (GLOVE) ×1 IMPLANT
GLOVE BIOGEL PI IND STRL 8 (GLOVE) ×1 IMPLANT
GLOVE BIOGEL PI INDICATOR 7.5 (GLOVE) ×2
GLOVE BIOGEL PI INDICATOR 8 (GLOVE) ×2
GLOVE SKINSENSE NS SZ7.0 (GLOVE) ×2
GLOVE SKINSENSE STRL SZ7.0 (GLOVE) ×1 IMPLANT
GOWN STRL REUS W/ TWL LRG LVL3 (GOWN DISPOSABLE) ×3 IMPLANT
GOWN STRL REUS W/TWL LRG LVL3 (GOWN DISPOSABLE) ×6
KIT BASIN OR (CUSTOM PROCEDURE TRAY) ×3 IMPLANT
KIT ROOM TURNOVER OR (KITS) ×3 IMPLANT
KIT SHUNT ARGYLE CAROTID ART 6 (VASCULAR PRODUCTS) ×3 IMPLANT
LIQUID BAND (GAUZE/BANDAGES/DRESSINGS) ×3 IMPLANT
NEEDLE HYPO 25X1 1.5 SAFETY (NEEDLE) ×3 IMPLANT
NS IRRIG 1000ML POUR BTL (IV SOLUTION) ×6 IMPLANT
PACK CAROTID (CUSTOM PROCEDURE TRAY) ×3 IMPLANT
PAD ARMBOARD 7.5X6 YLW CONV (MISCELLANEOUS) ×6 IMPLANT
PATCH HEMASHIELD 8X150 (Vascular Products) ×3 IMPLANT
SHUNT CAROTID BYPASS 10 (VASCULAR PRODUCTS) IMPLANT
SHUNT CAROTID BYPASS 12FRX15.5 (VASCULAR PRODUCTS) IMPLANT
SPONGE INTESTINAL PEANUT (DISPOSABLE) ×3 IMPLANT
SPONGE SURGIFOAM ABS GEL 100 (HEMOSTASIS) IMPLANT
SUT PROLENE 6 0 BV (SUTURE) ×6 IMPLANT
SUT PROLENE 6 0 CC (SUTURE) ×18 IMPLANT
SUT PROLENE 7 0 BV 1 (SUTURE) IMPLANT
SUT SILK 2 0 FS (SUTURE) IMPLANT
SUT VIC AB 3-0 SH 27 (SUTURE) ×2
SUT VIC AB 3-0 SH 27X BRD (SUTURE) ×1 IMPLANT
SUT VICRYL 4-0 PS2 18IN ABS (SUTURE) ×3 IMPLANT
SYR CONTROL 10ML LL (SYRINGE) ×3 IMPLANT
WATER STERILE IRR 1000ML POUR (IV SOLUTION) ×3 IMPLANT

## 2015-03-13 NOTE — Progress Notes (Signed)
   VASCULAR SURGERY POST OP CHECK:  * Doing well post op.  * Anticipate d/c in AM  * BP stable   SUBJECTIVE: No complaints.   PHYSICAL EXAM: Filed Vitals:   03/13/15 1324 03/13/15 1328 03/13/15 1339 03/13/15 1350  BP: 96/50 102/47 104/48   Pulse: 70 70 75   Temp:    97.6 F (36.4 C)  TempSrc:      Resp: 19 20 21    Weight:      SpO2: 99% 98% 100%    NEURO: intact Incision looks fine  LABS: Lab Results  Component Value Date   WBC 15.3* 03/13/2015   HGB 11.5* 03/13/2015   HCT 34.9* 03/13/2015   MCV 96.1 03/13/2015   PLT 146* 03/13/2015   Lab Results  Component Value Date   CREATININE 1.25 03/13/2015   Lab Results  Component Value Date   INR 1.02 03/13/2015   CBG (last 3)   Recent Labs  03/13/15 0559 03/13/15 1039  GLUCAP 203* 224*    Active Problems:   Left carotid artery stenosis   Gae Gallop Beeper: [phone] 03/13/2015

## 2015-03-13 NOTE — Progress Notes (Signed)
Systolic remains <248LYHT after 1st 500cc NS bolus. Will repeat bolus per MD order and cont to monitor. Pt remains easily arousable and denies light headedness nor any other abnormal feeling

## 2015-03-13 NOTE — Progress Notes (Signed)
Dr Scot Dock notified of patient's hypotension despite 2x500 cc NS boluses. Order to start Dopamine @ 5 mcg/kg/min and titrate  for a minimum SPB of 100 mmHG.

## 2015-03-13 NOTE — Transfer of Care (Signed)
Immediate Anesthesia Transfer of Care Note  Patient: [name]  Procedure(s) Performed: Procedure(s): Left carotid endarterectomy with patch angioplasty and resection of redundant carotid artery (Left) PATCH ANGIOPLASTY (Left)  Patient Location: PACU  Anesthesia Type:General  Level of Consciousness: awake, alert  and oriented  Airway & Oxygen Therapy: Patient Spontanous Breathing and Patient connected to face mask oxygen  Post-op Assessment: Report given to RN and Post -op Vital signs reviewed and stable  Post vital signs: Reviewed and stable  Last Vitals:  Filed Vitals:   03/13/15 0556  BP: 170/67  Pulse: 81  Temp: 36.4 C  Resp: 18    Complications: No apparent anesthesia complications

## 2015-03-13 NOTE — Progress Notes (Signed)
BP again dropping to less than 209 systolic. Turning Dopamine back up to 40mcg/kg/min

## 2015-03-13 NOTE — Progress Notes (Signed)
BP still low. Dr.Germeroth to bedside. 500cc NS IVFB will be given per MD and cont to monitor.

## 2015-03-13 NOTE — Interval H&P Note (Signed)
History and Physical Interval Note:  03/13/2015 7:17 AM  [name]  has presented today for surgery, with the diagnosis of Left carotid artery stenosis I65.22  The various methods of treatment have been discussed with the patient and family. After consideration of risks, benefits and other options for treatment, the patient has consented to  Procedure(s): ENDARTERECTOMY CAROTID (Left) as a surgical intervention .  The patient's history has been reviewed, patient examined, no change in status, stable for surgery.  I have reviewed the patient's chart and labs.  Questions were answered to the patient's satisfaction.     DICKSON,CHRISTOPHER S

## 2015-03-13 NOTE — Progress Notes (Signed)
Called-Dr.Dickson to bedside. BP very high the very low. Pt asymptomatic. Orders received just to cont to titrate Dopamine down slowly. Will cont to monitor.

## 2015-03-13 NOTE — Progress Notes (Signed)
MD aware of CBG. No new orders.

## 2015-03-13 NOTE — H&P (View-Only) (Signed)
[facility] [place]  Patient name: [name] MRN: [id number] DOB: 23-Mar-1930 Sex: male  REASON FOR CONSULT: greater than 80% left carotid stenosis  HPI: [name] is a 81 y.o. male who was referred by Dr. Fletcher Anon with a greater than 80% left carotid stenosis. He is right-handed.  He states that he had a remote stroke in the past but cannot remember the details. He does have some chronic paresthesias in the right hand. He denies any recent history of stroke, TIAs, expressive or receptive aphasia, or amaurosis fugax.  He denies any recent history of chest pain or chest pressure. He does have some dyspnea on exertion. Dr. Fletcher Anon felt that the patient was moderate to high-risk for cardiac complications but that no further workup would be indicated unless the surgery appeared to be high-risk. His EF is 34%. Recent cardiac workup showed patent grafts except for a chronically occluded saphenous vein graft to the diagonal and anastomosis disease affecting the saphenous vein graft to the obtuse marginal which was not amenable to PCI.  He is on aspirin and is on a statin.   Past Medical History  Diagnosis Date  . CAD (coronary artery disease)   . Renal artery atherosclerosis   . AAA (abdominal aortic aneurysm)   . Carotid artery stenosis   . History of ASCVD (atherosclerotic cardiovascular disease)   . HTN (hypertension)   . Arterial stenosis   . PVD (peripheral vascular disease)   . HLD (hyperlipidemia)   . DM (diabetes mellitus)   . COPD (chronic obstructive pulmonary disease)    Family History  Problem Relation Age of Onset  . Melanoma    . Heart disease    . Alcohol abuse    . Diabetes     SOCIAL HISTORY: History  Substance Use Topics  . Smoking status: Former Research scientist (life sciences)  . Smokeless tobacco: Not on file  . Alcohol Use: 0.6 oz/week    1 Glasses of wine per week   No Known Allergies Current Outpatient Prescriptions  Medication Sig Dispense Refill  .  acetaminophen (TYLENOL) 325 MG tablet Take 650 mg by mouth every 6 (six) hours as needed for mild pain.    Marland Kitchen aspirin 81 MG tablet Take 81 mg by mouth daily.    Marland Kitchen atorvastatin (LIPITOR) 40 MG tablet Take 20 mg by mouth every evening. Patient taking one half tablet (20mg ) by mouth every evening    . B-D ULTRAFINE III SHORT PEN 31G X 8 MM MISC USE TO INJECT LANTUS INSULIN DAILY 100 each 0  . budesonide-formoterol (SYMBICORT) 160-4.5 MCG/ACT inhaler Inhale 2 puffs into the lungs 2 (two) times daily.    . cyanocobalamin (,VITAMIN B-12,) 1000 MCG/ML injection Inject 1,000 mcg into the muscle every 30 (thirty) days.    . enalapril (VASOTEC) 5 MG tablet Take 2.5 mg by mouth daily.    . ferrous fumarate (HEMOCYTE - 106 MG FE) 325 (106 FE) MG TABS tablet Take 0.5 tablets by mouth daily.     . insulin glargine (LANTUS) 100 UNIT/ML injection Inject 50 Units into the skin at bedtime.    . metFORMIN (GLUCOPHAGE) 1000 MG tablet Take 500 mg by mouth 2 (two) times daily with a meal. (VA CHANGED)Taking one half tablet in the morning by mouth and taking one half tablet by mouth in the evening    . metoprolol (LOPRESSOR) 50 MG tablet Take 25 mg by mouth 2 (two) times daily. Pt takes 1/2 am 1/2 pm    .  Multiple Vitamins-Minerals (VITEYES AREDS FORMULA) CAPS Take 1 capsule by mouth 2 (two) times daily.    . niacin (NIASPAN) 500 MG CR tablet Take 500 mg by mouth at bedtime.    . nitroGLYCERIN (NITROSTAT) 0.4 MG SL tablet Place 1 tablet (0.4 mg total) under the tongue every 5 (five) minutes as needed. 25 tablet 6  . tiotropium (SPIRIVA HANDIHALER) 18 MCG inhalation capsule Place 18 mcg into inhaler and inhale daily.     No current facility-administered medications for this visit.   REVIEW OF SYSTEMS: Valu.Nieves ] denotes positive finding; [  ] denotes negative finding  CARDIOVASCULAR:  [ ]  chest pain   [ ]  chest pressure   [ ]  palpitations   Valu.Nieves ] orthopnea   Valu.Nieves ] dyspnea on exertion   [ ]  claudication   [ ]  rest pain   [ ]  DVT    [ ]  phlebitis PULMONARY:   [ ]  productive cough   [ ]  asthma   [ ]  wheezing NEUROLOGIC:   [ ]  weakness  [ ]  paresthesias  [ ]  aphasia  [ ]  amaurosis  [ ]  dizziness HEMATOLOGIC:   [ ]  bleeding problems   [ ]  clotting disorders MUSCULOSKELETAL:  [ ]  joint pain   [ ]  joint swelling [ ]  leg swelling GASTROINTESTINAL: [ ]   blood in stool  [ ]   hematemesis GENITOURINARY:  [ ]   dysuria  [ ]   hematuria PSYCHIATRIC:  [ ]  history of major depression INTEGUMENTARY:  [ ]  rashes  [ ]  ulcers CONSTITUTIONAL:  [ ]  fever   [ ]  chills  PHYSICAL EXAM: Filed Vitals:   03/07/15 1038 03/07/15 1041 03/07/15 1042  BP: 145/69 129/50 134/65  Pulse: 62 56   Height: 5\' 10"  (1.778 m)    Weight: 232 lb (105.235 kg)    SpO2: 100%     Body mass index is 33.29 kg/(m^2). GENERAL: The patient is a well-nourished male, in no acute distress. The vital signs are documented above. CARDIOVASCULAR: There is a regular rate and rhythm. He has bilateral carotid bruits. PULMONARY: There is good air exchange bilaterally without wheezing or rales. ABDOMEN: Soft and non-tender with normal pitched bowel sounds.  MUSCULOSKELETAL: There are no major deformities or cyanosis. NEUROLOGIC: No focal weakness or paresthesias are detected. SKIN: There are no ulcers or rashes noted. PSYCHIATRIC: The patient has a normal affect.  DATA:  I have reviewed his carotid duplex scan from the [facility] office. Peak systolic of the proximal left internal carotid artery was 434 cm/s with an end-diastolic velocity 382 cm/s suggesting a greater than 80% stenosis. On the right side he had a 60-79% stenosis.  I have independently interpreted the carotid duplex scan in our office today which on the left side, shows a peak systolic velocity of 505 cm/s with an end-diastolic velocity of 99 cm/s. ICA to CCA ratio was 7.6. Based on the peak systolic velocity and ICA to CCA ratio I think this study confirms that there is a greater than 80% left carotid  stenosis.   MEDICAL ISSUES:  GREATER THAN 80% ASYMPTOMATIC LEFT CAROTID STENOSIS: Given the severity of the left carotid stenosis I have recommended left carotid endarterectomy. I have reviewed the indications for carotid endarterectomy, that is to lower the risk of future stroke. I have also reviewed the potential complications of surgery, including but not limited to: bleeding, stroke (perioperative risk 1-2%), MI, nerve injury of other unpredictable medical problems. All of the patients questions were answered and they  are agreeable to proceed with surgery. He knows to continue his aspirin and statin right up through surgery. His surgery is scheduled for 03/13/2015.   [place] [facility] [place] Beeper: [phone]

## 2015-03-13 NOTE — Op Note (Signed)
NAME: [name]   MRN: [id number] DOB: Aug 10, 1930    DATE OF OPERATION: 03/13/2015  PREOP DIAGNOSIS: asymptomatic greater than 80% left carotid stenosis  POSTOP DIAGNOSIS: same  PROCEDURE:  1. Left carotid endarterectomy with Dacron patch angioplasty 2. Resection of redundant left common carotid artery  SURGEON: Judeth Cornfield. Scot Dock, MD, FACS  ASSIST: Leontine Locket, PA  ANESTHESIA: Gen.   EBL: minimal  INDICATIONS: [name] is a 81 y.o. male who was found to have a greater than 80% left carotid stenosis. Left carotid endarterectomy was recommended in order to lower his risk of future stroke.  FINDINGS: There was extensive plaque extending well down into the common carotid artery area the patient had a high bifurcation. The artery was also redundant mostly in the common carotid section. This reason I resected a redundant portion of the common carotid artery.  TECHNIQUE: The patient was taken to the operating room and received a general anesthetic. The left neck was prepped and draped in usual sterile fashion. An incision was made along the anterior border of the sternocleidomastoid and dissection carried down to the common carotid artery which was dissected free and controlled with Rummel tourniquet. Of note there was a nerve crossing the bifurcation and it was difficult to tell if this was the ansa cervicalis, although it seemed large for that, versus a nonrecurrent laryngeal nerve. For this reason, I elected to not sacrifice this nerve and therefore worked around it. I was able to dissected above the plaque into the internal carotid artery which was quite high and this was controlled with a vessel loop. Superior thyroid artery and external carotid arteries were also controlled. The patient then received 9000 with IV heparin. Clamps were then placed on the internal and the external then the common carotid artery. A longitudinal arteriotomy was made in the common carotid artery.  This was extended through the plaque into the internal carotid artery above the plaque. A 12 shunt was placed into the internal carotid artery, it was then back bled and then placed into the common carotid artery and secured with Rummel tourniquet. Flow is reestablished shunt and flow was checked with a Doppler.  An endarterectomy plane was established proximally and the plaque was sharply divided. Eversion endarterectomy was performed of the external carotid artery. Distally it was a nice taper and the plaque and no tacking sutures were required. The artery was irrigated with Saline and Dextran and All Loose Debris Removed. The Common Carotid Artery Was Significantly Redundant.  Therefore, I placed tacking sutures and then excised approximately 3 cm of redundant common carotid artery. The backwall was then sewn back together with a 2 6-0's. A Dacron patch was then selected and this was sewn using continuous 6-0 Prolene suture. Prior to completion the patch closure, the arteries were backbled and flushed properly after the shunt was removed. The closure was completed and then flow reestablished first to the external carotid artery and into the internal carotid artery. At the completion was a good Doppler signal distal to the patch with good diastolic flow. The heparin was partially reversed with protamine.  The wound was closed with a plantar fascial Vicryl. The platysma was closed with running 3-0 Vicryl. The skin was closed with a 40 septic or stitch. Dermabond was applied. The patient awoke neurologically intact. All needle and sponge counts were correct. The patient was transferred to the recovery room in stable condition.  Deitra Mayo, MD, FACS [facility]  [place]  DATE OF DICTATION:   03/13/2015

## 2015-03-13 NOTE — Anesthesia Postprocedure Evaluation (Signed)
Anesthesia Post Note  Patient: [name]  Procedure(s) Performed: Procedure(s) (LRB): Left carotid endarterectomy with patch angioplasty and resection of redundant carotid artery (Left) PATCH ANGIOPLASTY (Left)  Anesthesia type: General  Patient location: PACU  Post pain: Pain level controlled  Post assessment: Post-op Vital signs reviewed  Last Vitals: BP 104/48 mmHg  Pulse 75  Temp(Src) 36.4 C (Oral)  Resp 21  Wt 232 lb (105.235 kg)  SpO2 100%  Post vital signs: Reviewed  Level of consciousness: sedated  Complications: No apparent anesthesia complications

## 2015-03-13 NOTE — Telephone Encounter (Addendum)
-----   Message from Gabriel Earing, [place] sent at 03/13/2015 10:13 AM EDT ----- S/p left CEA 03/13/15.  F/u with Dr. Scot Dock in 2 weeks.  Thanks, Samantha  03/13/15: LM for pt to call and confirm appt, dpm

## 2015-03-13 NOTE — Anesthesia Procedure Notes (Signed)
Procedure Name: Intubation Date/Time: 03/13/2015 7:45 AM Performed by: Clearnce Sorrel Pre-anesthesia Checklist: Patient identified, Emergency Drugs available, Suction available, Patient being monitored and Timeout performed Patient Re-evaluated:Patient Re-evaluated prior to inductionOxygen Delivery Method: Circle system utilized Preoxygenation: Pre-oxygenation with 100% oxygen Intubation Type: IV induction Ventilation: Mask ventilation without difficulty and Oral airway inserted - appropriate to patient size Laryngoscope Size: Mac and 3 Grade View: Grade I Tube type: Oral Tube size: 7.5 mm Number of attempts: 1 Airway Equipment and Method: Stylet,  Oral airway and LTA kit utilized Placement Confirmation: ETT inserted through vocal cords under direct vision,  positive ETCO2,  CO2 detector and breath sounds checked- equal and bilateral Secured at: 23 cm Tube secured with: Tape Dental Injury: Teeth and Oropharynx as per pre-operative assessment

## 2015-03-13 NOTE — Progress Notes (Signed)
Called Dr.Germeroth for sign out  

## 2015-03-14 ENCOUNTER — Encounter (HOSPITAL_COMMUNITY): Payer: Self-pay | Admitting: Vascular Surgery

## 2015-03-14 LAB — CBC
HCT: 33.8 % — ABNORMAL LOW (ref 39.0–52.0)
HEMOGLOBIN: 10.9 g/dL — AB (ref 13.0–17.0)
MCH: 31.5 pg (ref 26.0–34.0)
MCHC: 32.2 g/dL (ref 30.0–36.0)
MCV: 97.7 fL (ref 78.0–100.0)
Platelets: 166 10*3/uL (ref 150–400)
RBC: 3.46 MIL/uL — ABNORMAL LOW (ref 4.22–5.81)
RDW: 14.1 % (ref 11.5–15.5)
WBC: 10.4 10*3/uL (ref 4.0–10.5)

## 2015-03-14 LAB — BASIC METABOLIC PANEL
Anion gap: 5 (ref 5–15)
BUN: 23 mg/dL (ref 6–23)
CALCIUM: 8.2 mg/dL — AB (ref 8.4–10.5)
CO2: 26 mmol/L (ref 19–32)
Chloride: 103 mmol/L (ref 96–112)
Creatinine, Ser: 1.28 mg/dL (ref 0.50–1.35)
GFR calc non Af Amer: 50 mL/min — ABNORMAL LOW (ref >90)
GFR, EST AFRICAN AMERICAN: 58 mL/min — AB (ref >90)
GLUCOSE: 244 mg/dL — AB (ref 70–99)
POTASSIUM: 4.7 mmol/L (ref 3.5–5.1)
Sodium: 134 mmol/L — ABNORMAL LOW (ref 135–145)

## 2015-03-14 LAB — GLUCOSE, CAPILLARY: Glucose-Capillary: 189 mg/dL — ABNORMAL HIGH (ref 70–99)

## 2015-03-14 NOTE — Progress Notes (Signed)
   VASCULAR SURGERY ASSESSMENT & PLAN:  * 1 Day Post-Op s/p: Left CEA  *  Doing well.  * Home today.   SUBJECTIVE: No complaints.  PHYSICAL EXAM: Filed Vitals:   03/14/15 0016 03/14/15 0300 03/14/15 0404 03/14/15 0700  BP: 97/32 141/61  119/53  Pulse: 77 75  81  Temp:   98.7 F (37.1 C)   TempSrc:   Oral   Resp: 20 21  22   Weight:      SpO2: 96% 96%  92%   Neuro intact Incision looks fine  LABS: Lab Results  Component Value Date   WBC 10.4 03/14/2015   HGB 10.9* 03/14/2015   HCT 33.8* 03/14/2015   MCV 97.7 03/14/2015   PLT 166 03/14/2015   Lab Results  Component Value Date   CREATININE 1.28 03/14/2015   Lab Results  Component Value Date   INR 1.02 03/13/2015   CBG (last 3)   Recent Labs  03/13/15 0559 03/13/15 1039  GLUCAP 203* 224*    Active Problems:   Left carotid artery stenosis   Gae Gallop Beeper: [phone] 03/14/2015

## 2015-03-14 NOTE — Progress Notes (Signed)
Discharge instructions review with patient and wife at bedside. Rx given. Rolling walker brought by Nicholson and is at bedside. All questions answered to patient satisfaction. VS stable, pt in no acute distress. Discharged to vehicle via wheelchair by NT.

## 2015-03-14 NOTE — Progress Notes (Signed)
SATURATION QUALIFICATIONS: (This note is used to comply with regulatory documentation for home oxygen)  Patient Saturations on Room Air at Rest = 98%  Patient Saturations on Room Air while Ambulating = 82-89%  Patient Saturations on 1 Liters of oxygen while Ambulating = 92%  Please briefly explain why patient needs home oxygen: Patient desaturates into the 80s while ambulating. He is saturating in the mid to upper 90s on RA but will need extra assistance when doing activities.

## 2015-03-14 NOTE — Progress Notes (Signed)
Pt's R hand swollen. Pt reports baseline numbness and full sensation. Pt is able to move all digits. Bilateral grips are strong and equal. Arterial line removed, BP cuff switched to L arm, RUE elevated on 2 pillows, and ice applied. Will continue to monitor closely.

## 2015-03-14 NOTE — Discharge Summary (Signed)
Discharge Summary     [name] [birth year] 81 y.o. male  [id number]  Admission Date: 03/13/2015  Discharge Date: 03/14/15  Physician: Angelia Mould, MD  Admission Diagnosis: Left carotid artery stenosis I65.22   HPI:   This is a 81 y.o. male who was referred by Dr. Fletcher Anon with a greater than 80% left carotid stenosis. He is right-handed. He states that he had a remote stroke in the past but cannot remember the details. He does have some chronic paresthesias in the right hand. He denies any recent history of stroke, TIAs, expressive or receptive aphasia, or amaurosis fugax.  He denies any recent history of chest pain or chest pressure. He does have some dyspnea on exertion. Dr. Fletcher Anon felt that the patient was moderate to high-risk for cardiac complications but that no further workup would be indicated unless the surgery appeared to be high-risk. His EF is 34%. Recent cardiac workup showed patent grafts except for a chronically occluded saphenous vein graft to the diagonal and anastomosis disease affecting the saphenous vein graft to the obtuse marginal which was not amenable to PCI.  He is on aspirin and is on a statin.  Hospital Course:  The patient was admitted to the hospital and taken to the operating room on 03/13/2015 and underwent left carotid endarterectomy.  The pt tolerated the procedure well and was transported to the PACU in good condition.  In PACU, he did require a dopamine gtt.  His pressure was labile and this was eventually weaned.   By POD 1, the pt neuro status was in tact.  He did have mild swelling of his right hand.  He was at baseline numbness and full sensation per RN note.  His neuro status was in tact.   The remainder of the hospital course consisted of increasing mobilization and increasing intake of solids without difficulty.    Recent Labs  03/13/15 0605 03/14/15 0231  NA 133* 134*  K 4.4 4.7  CL 99 103  CO2 25 26  GLUCOSE 204* 244*    BUN 29* 23  CALCIUM 9.0 8.2*    Recent Labs  03/13/15 1130 03/14/15 0231  WBC 15.3* 10.4  HGB 11.5* 10.9*  HCT 34.9* 33.8*  PLT 146* 166    Recent Labs  03/13/15 0605  INR 1.02     Discharge Instructions    CAROTID Sugery: Call MD for difficulty swallowing or speaking; weakness in arms or legs that is a new symtom; severe headache.  If you have increased swelling in the neck and/or  are having difficulty breathing, CALL [phone]    Complete by:  As directed      Call MD for:  redness, tenderness, or signs of infection (pain, swelling, bleeding, redness, odor or green/yellow discharge around incision site)    Complete by:  As directed      Call MD for:  severe or increased pain, loss or decreased feeling  in affected limb(s)    Complete by:  As directed      Call MD for:  temperature >100.5    Complete by:  As directed      Discharge wound care:    Complete by:  As directed   Shower daily with soap and water starting 03/15/15     Driving Restrictions    Complete by:  As directed   No driving for 2 weeks     Lifting restrictions    Complete by:  As directed   No lifting  for 2 weeks     Resume previous diet    Complete by:  As directed            Discharge Diagnosis:  Left carotid artery stenosis I65.22  Secondary Diagnosis: Patient Active Problem List   Diagnosis Date Noted  . Left carotid artery stenosis 03/13/2015  . Pre-operative cardiovascular examination 03/06/2015  . Obesity (BMI 30-39.9) 11/27/2011  . Diabetes mellitus due to underlying condition with diabetic neuropathy 06/17/2011  . Hypertension associated with diabetes 06/17/2011  . Hyperlipidemia LDL goal <70 06/17/2011  . ASHD (arteriosclerotic heart disease) 06/17/2011  . CAD, NATIVE VESSEL 10/08/2010  . RENAL ARTERY ATHEROSCLEROSIS 11/07/2009  . Left carotid bruit 04/16/2009  . DM 04/13/2009  . ATHEROSCLEROTIC CARDIOVASCULAR DISEASE 04/13/2009  . Peripheral vascular disease 04/13/2009  .  Bilateral carotid artery stenosis 04/13/2009   Past Medical History  Diagnosis Date  . CAD (coronary artery disease)   . Renal artery atherosclerosis   . AAA (abdominal aortic aneurysm)   . Carotid artery stenosis   . History of ASCVD (atherosclerotic cardiovascular disease)   . HTN (hypertension)   . Arterial stenosis   . PVD (peripheral vascular disease)   . HLD (hyperlipidemia)   . DM (diabetes mellitus)   . COPD (chronic obstructive pulmonary disease)   . Shortness of breath dyspnea     with exertion  . GERD (gastroesophageal reflux disease)   . Multiple gastric ulcers     hx of  . Numbness of hand     right      Medication List    TAKE these medications        acetaminophen 325 MG tablet  Commonly known as:  TYLENOL  Take 650 mg by mouth every 6 (six) hours as needed for mild pain.     aspirin 81 MG tablet  Take 81 mg by mouth daily.     atorvastatin 40 MG tablet  Commonly known as:  LIPITOR  Take 20 mg by mouth every evening. Patient taking one half tablet (20mg ) by mouth every evening     B-D ULTRAFINE III SHORT PEN 31G X 8 MM Misc  Generic drug:  Insulin Pen Needle  USE TO INJECT LANTUS INSULIN DAILY     budesonide-formoterol 160-4.5 MCG/ACT inhaler  Commonly known as:  SYMBICORT  Inhale 2 puffs into the lungs 2 (two) times daily.     cyanocobalamin 1000 MCG/ML injection  Commonly known as:  (VITAMIN B-12)  Inject 1,000 mcg into the muscle every 30 (thirty) days.     enalapril 5 MG tablet  Commonly known as:  VASOTEC  Take 2.5 mg by mouth daily.     ferrous fumarate 325 (106 FE) MG Tabs tablet  Commonly known as:  HEMOCYTE - 106 mg FE  Take 0.5 tablets by mouth daily.     insulin glargine 100 UNIT/ML injection  Commonly known as:  LANTUS  Inject 50 Units into the skin at bedtime.     metFORMIN 1000 MG tablet  Commonly known as:  GLUCOPHAGE  Take 500 mg by mouth 2 (two) times daily with a meal. (VA CHANGED)Taking one half tablet in the morning  by mouth and taking one half tablet by mouth in the evening     metoprolol 50 MG tablet  Commonly known as:  LOPRESSOR  Take 25 mg by mouth 2 (two) times daily. Pt takes 1/2 am 1/2 pm     niacin 500 MG CR tablet  Commonly known as:  NIASPAN  Take 500 mg by mouth at bedtime.     nitroGLYCERIN 0.4 MG SL tablet  Commonly known as:  NITROSTAT  Place 1 tablet (0.4 mg total) under the tongue every 5 (five) minutes as needed.     omeprazole 20 MG tablet  Commonly known as:  PRILOSEC OTC  Take 20 mg by mouth daily as needed.     oxyCODONE 5 MG immediate release tablet  Commonly known as:  ROXICODONE  Take 1 tablet (5 mg total) by mouth every 6 (six) hours as needed.     SPIRIVA HANDIHALER 18 MCG inhalation capsule  Generic drug:  tiotropium  Place 18 mcg into inhaler and inhale daily.     VITEYES AREDS FORMULA Caps  Take 1 capsule by mouth 2 (two) times daily.        Prescriptions given: Roxicodone #20 No Refill  Disposition: home  Patient's condition: is Good  Follow up: 1. Dr. Scot Dock in 2 weeks.   Leontine Locket, PA-C [facility] [phone]  --- For [facility] use --- Instructions: Press F2 to tab through selections.  Delete question if not applicable.   Modified Rankin score at D/C (0-6): 0  IV medication needed for:  1. Hypertension: No 2. Hypotension: Yes  Post-op Complications: No  1. Post-op CVA or TIA: No  If yes: Event classification (right eye, left eye, right cortical, left cortical, verterobasilar, other): n/an  If yes: Timing of event (intra-op, <6 hrs post-op, >=6 hrs post-op, unknown): n/a  2. CN injury: No  If yes: CN n/a injuried   3. Myocardial infarction: No  If yes: Dx by (EKG or clinical, Troponin): n/a  4.  CHF: No  5.  Dysrhythmia (new): No  6. Wound infection: No  7. Reperfusion symptoms: No  8. Return to OR: No  If yes: return to OR for (bleeding, neurologic, other CEA incision, other):  n/a  Discharge medications: Statin use:  Yes If No: [ ]  For Medical reasons, [ ]  Non-compliant, [ ]  Not-indicated ASA use:  Yes  If No: [ ]  For Medical reasons, [ ]  Non-compliant, [ ]  Not-indicated Beta blocker use:  yes If No: [ ]  For Medical reasons, [ ]  Non-compliant, [ ]  Not-indicated ACE-Inhibitor use:  Yes If No: [ ]  For Medical reasons, [ ]  Non-compliant, [ ]  Not-indicated P2Y12 Antagonist use: No, [ ]  Plavix, [ ]  Plasugrel, [ ]  Ticlopinine, [ ]  Ticagrelor, [ ]  Other, [ ]  No for medical reason, [ ]  Non-compliant, [ ]  Not-indicated Anti-coagulant use:  No, [ ]  Warfarin, [ ]  Rivaroxaban, [ ]  Dabigatran, [ ]  Other, [ ]  No for medical reason, [ ]  Non-compliant, [ ]  Not-indicated

## 2015-03-16 ENCOUNTER — Ambulatory Visit: Payer: Medicare Other | Admitting: Family Medicine

## 2015-03-19 NOTE — Telephone Encounter (Signed)
Spoke with patient to confirm appointment, dpm

## 2015-03-27 ENCOUNTER — Encounter: Payer: Self-pay | Admitting: Vascular Surgery

## 2015-03-28 ENCOUNTER — Ambulatory Visit (INDEPENDENT_AMBULATORY_CARE_PROVIDER_SITE_OTHER): Payer: Self-pay | Admitting: Vascular Surgery

## 2015-03-28 ENCOUNTER — Encounter: Payer: Self-pay | Admitting: Vascular Surgery

## 2015-03-28 VITALS — BP 115/55 | HR 74 | Ht 70.0 in | Wt 232.0 lb

## 2015-03-28 DIAGNOSIS — Z48812 Encounter for surgical aftercare following surgery on the circulatory system: Secondary | ICD-10-CM

## 2015-03-28 NOTE — Progress Notes (Signed)
[facility] [place]  Patient name: [name] MRN: [id number] DOB: 1930-01-09 Sex: male  REASON FOR VISIT: follow up after left carotid endarterectomy.  HPI: [name] is a 81 y.o. male who was found to have a greater than 80% left carotid stenosis. Left carotid endarterectomy was recommended to lower his risk of future stroke. He underwent a left carotid endarterectomy with Dacron patch angioplasty and resection of redundant left common carotid artery on 03/13/2015. He did well postoperatively and was discharged on postoperative day #1. He returns for his first outpatient visit.  In reviewing his operative report he had a very extensive plaque which extended well down into the common carotid artery. He also had a high bifurcation.   He has no specific complaints and overall been doing quite well. He has no problems swallowing. He has no focal weakness or paresthesias.   Past Medical History  Diagnosis Date  . CAD (coronary artery disease)   . Renal artery atherosclerosis   . AAA (abdominal aortic aneurysm)   . Carotid artery stenosis   . History of ASCVD (atherosclerotic cardiovascular disease)   . HTN (hypertension)   . Arterial stenosis   . PVD (peripheral vascular disease)   . HLD (hyperlipidemia)   . DM (diabetes mellitus)   . COPD (chronic obstructive pulmonary disease)   . Shortness of breath dyspnea     with exertion  . GERD (gastroesophageal reflux disease)   . Multiple gastric ulcers     hx of  . Numbness of hand     right   Family History  Problem Relation Age of Onset  . Melanoma    . Heart disease    . Alcohol abuse    . Diabetes     SOCIAL HISTORY: History  Substance Use Topics  . Smoking status: Former Research scientist (life sciences)  . Smokeless tobacco: Not on file  . Alcohol Use: No   No Known Allergies Current Outpatient Prescriptions  Medication Sig Dispense Refill  . acetaminophen (TYLENOL) 325 MG tablet Take 650 mg by mouth every 6 (six)  hours as needed for mild pain.    Marland Kitchen aspirin 81 MG tablet Take 81 mg by mouth daily.    Marland Kitchen atorvastatin (LIPITOR) 40 MG tablet Take 20 mg by mouth every evening. Patient taking one half tablet (20mg ) by mouth every evening    . B-D ULTRAFINE III SHORT PEN 31G X 8 MM MISC USE TO INJECT LANTUS INSULIN DAILY 100 each 0  . budesonide-formoterol (SYMBICORT) 160-4.5 MCG/ACT inhaler Inhale 2 puffs into the lungs 2 (two) times daily.    . cyanocobalamin (,VITAMIN B-12,) 1000 MCG/ML injection Inject 1,000 mcg into the muscle every 30 (thirty) days.    . enalapril (VASOTEC) 5 MG tablet Take 2.5 mg by mouth daily.    . ferrous fumarate (HEMOCYTE - 106 MG FE) 325 (106 FE) MG TABS tablet Take 0.5 tablets by mouth daily.     . insulin glargine (LANTUS) 100 UNIT/ML injection Inject 50 Units into the skin at bedtime.    . metFORMIN (GLUCOPHAGE) 1000 MG tablet Take 500 mg by mouth 2 (two) times daily with a meal. (VA CHANGED)Taking one half tablet in the morning by mouth and taking one half tablet by mouth in the evening    . metoprolol (LOPRESSOR) 50 MG tablet Take 25 mg by mouth 2 (two) times daily. Pt takes 1/2 am 1/2 pm    . Multiple Vitamins-Minerals (VITEYES AREDS FORMULA) CAPS Take 1  capsule by mouth 2 (two) times daily.    . niacin (NIASPAN) 500 MG CR tablet Take 500 mg by mouth at bedtime.    . nitroGLYCERIN (NITROSTAT) 0.4 MG SL tablet Place 1 tablet (0.4 mg total) under the tongue every 5 (five) minutes as needed. 25 tablet 6  . omeprazole (PRILOSEC OTC) 20 MG tablet Take 20 mg by mouth daily as needed.    Marland Kitchen oxyCODONE (ROXICODONE) 5 MG immediate release tablet Take 1 tablet (5 mg total) by mouth every 6 (six) hours as needed. 20 tablet 0  . tiotropium (SPIRIVA HANDIHALER) 18 MCG inhalation capsule Place 18 mcg into inhaler and inhale daily.     No current facility-administered medications for this visit.   REVIEW OF SYSTEMS: Valu.Nieves ] denotes positive finding; [  ] denotes negative finding  CARDIOVASCULAR:   [ ]  chest pain   [ ]  chest pressure    PULMONARY:   [ ]  productive cough   [ ]  asthma   [ ]  wheezing NEUROLOGIC:   [ ]  weakness  [ ]  paresthesias  [ ]  aphasia  [ ]  amaurosis  [ ]  dizziness CONSTITUTIONAL:  [ ]  fever   [ ]  chills  PHYSICAL EXAM: Filed Vitals:   03/28/15 1455 03/28/15 1456  BP: 127/65 115/55  Pulse: 75 74  Height: 5\' 10"  (1.778 m)   Weight: 232 lb (105.235 kg)   SpO2: 99%    GENERAL: The patient is a well-nourished male, in no acute distress. The vital signs are documented above. CARDIOVASCULAR: There is a regular rate and rhythm. I do not detect carotid bruits. PULMONARY: There is good air exchange bilaterally without wheezing or rales. ABDOMEN: Soft and non-tender with normal pitched bowel sounds.  MUSCULOSKELETAL: There are no major deformities or cyanosis. NEUROLOGIC: No focal weakness or paresthesias are detected. SKIN: His left neck incision is healing nicely. PSYCHIATRIC: The patient has a normal affect.  MEDICAL ISSUES: The patient is doing well status post left carotid endarterectomy with resection of redundant left common carotid artery. He is on aspirin. He is on a statin. He is not a smoker. I ordered a follow carotid duplex scan in 6 months and I'll see him back at that time. He knows to call sooner if he has problems.   No Follow-up on file.   [facility] [place] [facility]: [phone]

## 2015-03-28 NOTE — Addendum Note (Signed)
Addended by: Mena Goes on: 03/28/2015 04:23 PM   Modules accepted: Orders

## 2015-04-30 ENCOUNTER — Ambulatory Visit (INDEPENDENT_AMBULATORY_CARE_PROVIDER_SITE_OTHER): Payer: Medicare Other | Admitting: Family Medicine

## 2015-04-30 ENCOUNTER — Encounter: Payer: Self-pay | Admitting: Family Medicine

## 2015-04-30 ENCOUNTER — Encounter: Payer: Self-pay | Admitting: Vascular Surgery

## 2015-04-30 VITALS — BP 128/68 | HR 54 | Wt 231.0 lb

## 2015-04-30 DIAGNOSIS — Z5189 Encounter for other specified aftercare: Secondary | ICD-10-CM

## 2015-04-30 NOTE — Progress Notes (Signed)
   Subjective:    Patient ID: [name], male    DOB: [birth year], 81 y.o.   MRN: [id number]  HPI Approximately a week ago he noted some swelling and discoloration at the distal end of recent surgical scar on the left. He is here for further evaluation of this.   Review of Systems     Objective:   Physical Exam Alert and in no distress. The majority of the wound looks quite good over the distal and does show a 2 cm reddish purple nontender lesion.       Assessment & Plan:  Wound check, abscess Refer back to vascular surgery.

## 2015-05-01 DIAGNOSIS — I10 Essential (primary) hypertension: Secondary | ICD-10-CM | POA: Diagnosis not present

## 2015-05-01 DIAGNOSIS — T814XXA Infection following a procedure, initial encounter: Secondary | ICD-10-CM | POA: Diagnosis not present

## 2015-05-01 DIAGNOSIS — E119 Type 2 diabetes mellitus without complications: Secondary | ICD-10-CM | POA: Diagnosis not present

## 2015-05-01 DIAGNOSIS — L089 Local infection of the skin and subcutaneous tissue, unspecified: Secondary | ICD-10-CM | POA: Diagnosis not present

## 2015-05-01 DIAGNOSIS — J449 Chronic obstructive pulmonary disease, unspecified: Secondary | ICD-10-CM | POA: Diagnosis not present

## 2015-05-02 ENCOUNTER — Encounter: Payer: Self-pay | Admitting: Vascular Surgery

## 2015-05-02 ENCOUNTER — Ambulatory Visit (INDEPENDENT_AMBULATORY_CARE_PROVIDER_SITE_OTHER): Payer: Self-pay | Admitting: Vascular Surgery

## 2015-05-02 VITALS — BP 136/47 | HR 62 | Temp 97.5°F | Ht 70.0 in | Wt 232.0 lb

## 2015-05-02 DIAGNOSIS — Z48812 Encounter for surgical aftercare following surgery on the circulatory system: Secondary | ICD-10-CM

## 2015-05-02 NOTE — Progress Notes (Signed)
Patient name: [name] MRN: [id number] DOB: Apr 19, 1930 Sex: male  REASON FOR VISIT: follow up after left carotid endarterectomy  HPI: [name] is a 81 y.o. male who was found to have a greater than 80% left carotid stenosis. Left carotid endarterectomy was recommended in order to lower his risk of future stroke. He underwent left carotid endarterectomy with Dacron patch angioplasty and resection of redundant left common carotid artery on 03/13/2015. The patient had extensive plaque extending into the common carotid artery and also high bifurcation.  He developed some swelling at the inferior aspect of his incision and went to the emergency department last night. He was started on Keflex. They drain some purulent material from the inferior aspect of the incision. He denies fever or chills.  Current Outpatient Prescriptions  Medication Sig Dispense Refill  . acetaminophen (TYLENOL) 325 MG tablet Take 650 mg by mouth every 6 (six) hours as needed for mild pain.    Marland Kitchen aspirin 81 MG tablet Take 81 mg by mouth daily.    Marland Kitchen atorvastatin (LIPITOR) 40 MG tablet Take 20 mg by mouth every evening. Patient taking one half tablet (20mg ) by mouth every evening    . B-D ULTRAFINE III SHORT PEN 31G X 8 MM MISC USE TO INJECT LANTUS INSULIN DAILY 100 each 0  . budesonide-formoterol (SYMBICORT) 160-4.5 MCG/ACT inhaler Inhale 2 puffs into the lungs 2 (two) times daily.    . cephALEXin (KEFLEX) 500 MG capsule Take 500 mg by mouth 4 (four) times daily.    . cyanocobalamin (,VITAMIN B-12,) 1000 MCG/ML injection Inject 1,000 mcg into the muscle every 30 (thirty) days.    . enalapril (VASOTEC) 5 MG tablet Take 2.5 mg by mouth daily.    . ferrous fumarate (HEMOCYTE - 106 MG FE) 325 (106 FE) MG TABS tablet Take 0.5 tablets by mouth daily.     . insulin glargine (LANTUS) 100 UNIT/ML injection Inject 55 Units into the skin at bedtime.     . metFORMIN (GLUCOPHAGE) 1000 MG tablet Take 500 mg by mouth 2 (two) times  daily with a meal. (VA CHANGED)Taking one half tablet in the morning by mouth and taking one half tablet by mouth in the evening    . metoprolol (LOPRESSOR) 50 MG tablet Take 25 mg by mouth 2 (two) times daily. Pt takes 1/2 am 1/2 pm    . Multiple Vitamins-Minerals (VITEYES AREDS FORMULA) CAPS Take 1 capsule by mouth 2 (two) times daily.    . niacin (NIASPAN) 500 MG CR tablet Take 500 mg by mouth at bedtime.    . nitroGLYCERIN (NITROSTAT) 0.4 MG SL tablet Place 1 tablet (0.4 mg total) under the tongue every 5 (five) minutes as needed. 25 tablet 6  . omeprazole (PRILOSEC OTC) 20 MG tablet Take 20 mg by mouth daily as needed.    Marland Kitchen oxyCODONE (ROXICODONE) 5 MG immediate release tablet Take 1 tablet (5 mg total) by mouth every 6 (six) hours as needed. 20 tablet 0  . tiotropium (SPIRIVA HANDIHALER) 18 MCG inhalation capsule Place 18 mcg into inhaler and inhale daily.     No current facility-administered medications for this visit.   REVIEW OF SYSTEMS: Valu.Nieves ] denotes positive finding; [  ] denotes negative finding  CARDIOVASCULAR:  [ ]  chest pain   [ ]  dyspnea on exertion    CONSTITUTIONAL:  [ ]  fever   [ ]  chills  PHYSICAL EXAM: Filed Vitals:   05/02/15 0931 05/02/15 0937  BP: 135/64 136/47  Pulse: 63 62  Temp: 97.5 F (36.4 C)   TempSrc: Oral   Height: 5\' 10"  (1.778 m)   Weight: 232 lb (105.235 kg)   SpO2: 100%    GENERAL: The patient is a well-nourished male, in no acute distress. The vital signs are documented above. CARDIOVASCULAR: There is a regular rate and rhythm. PULMONARY: There is good air exchange bilaterally without wheezing or rales. There is some swelling and erythema at the distal aspect of his incision and I I&D'd this in the office today. There was no abscess noted. I did remove the knot in this area and he may simply have a stitch abscess. This was then packed.  MEDICAL ISSUES: I instructed him to continue his Keflex. He will remove the dressing tomorrow and use bacitracin  and a Band-Aid on the inferior aspect of his incision. I'll see him back in 2 weeks. He knows to call sooner if he has problems.  Deitra Mayo [facility] [place]: [phone]

## 2015-05-11 ENCOUNTER — Encounter: Payer: Self-pay | Admitting: Vascular Surgery

## 2015-05-16 ENCOUNTER — Ambulatory Visit (INDEPENDENT_AMBULATORY_CARE_PROVIDER_SITE_OTHER): Payer: Self-pay | Admitting: Vascular Surgery

## 2015-05-16 ENCOUNTER — Other Ambulatory Visit: Payer: Self-pay

## 2015-05-16 ENCOUNTER — Telehealth: Payer: Self-pay | Admitting: Family Medicine

## 2015-05-16 ENCOUNTER — Encounter: Payer: Self-pay | Admitting: Vascular Surgery

## 2015-05-16 VITALS — BP 105/61 | HR 70 | Temp 97.7°F | Resp 16 | Ht 70.0 in | Wt 236.0 lb

## 2015-05-16 DIAGNOSIS — Z48812 Encounter for surgical aftercare following surgery on the circulatory system: Secondary | ICD-10-CM

## 2015-05-16 MED ORDER — METOPROLOL TARTRATE 50 MG PO TABS: 25.0000 mg | ORAL_TABLET | Freq: Two times a day (BID) | ORAL | Status: DC

## 2015-05-16 NOTE — Progress Notes (Signed)
Patient name: [name] MRN: [id number] DOB: 1930-04-13 Sex: male  REASON FOR VISIT: follow up of stitch abscess  HPI: [name] is a 81 y.o. male who underwent a left carotid endarterectomy for a greater than 80% left carotid stenosis. Also he had resection of redundant left common carotid artery and Dacron patch angioplasty. I saw him on 05/02/2015 with some swelling from the inferior aspect of his incision and it looked like he had a small stitch abscess. He had been seen in the emergency department and had been started on Keflex. I instructed him to continue his Keflex. I did an incision and drainage in the office and there was no abscess. I instructed him to keep bacitracin on the wound and cover it with a Band-Aid. He comes in for a 2 week follow up visit.  He has no specific complaints. There is been no drainage. He denies fever.  Current Outpatient Prescriptions  Medication Sig Dispense Refill  . acetaminophen (TYLENOL) 325 MG tablet Take 650 mg by mouth every 6 (six) hours as needed for mild pain.    Marland Kitchen aspirin 81 MG tablet Take 81 mg by mouth daily.    Marland Kitchen atorvastatin (LIPITOR) 40 MG tablet Take 20 mg by mouth every evening. Patient taking one half tablet (20mg ) by mouth every evening    . B-D ULTRAFINE III SHORT PEN 31G X 8 MM MISC USE TO INJECT LANTUS INSULIN DAILY 100 each 0  . budesonide-formoterol (SYMBICORT) 160-4.5 MCG/ACT inhaler Inhale 2 puffs into the lungs 2 (two) times daily.    . cephALEXin (KEFLEX) 500 MG capsule Take 500 mg by mouth 4 (four) times daily.    . cyanocobalamin (,VITAMIN B-12,) 1000 MCG/ML injection Inject 1,000 mcg into the muscle every 30 (thirty) days.    . enalapril (VASOTEC) 5 MG tablet Take 2.5 mg by mouth daily.    . ferrous fumarate (HEMOCYTE - 106 MG FE) 325 (106 FE) MG TABS tablet Take 0.5 tablets by mouth daily.     . insulin glargine (LANTUS) 100 UNIT/ML injection Inject 55 Units into the skin at bedtime.     . metFORMIN (GLUCOPHAGE) 1000  MG tablet Take 500 mg by mouth 2 (two) times daily with a meal. (VA CHANGED)Taking one half tablet in the morning by mouth and taking one half tablet by mouth in the evening    . metoprolol (LOPRESSOR) 50 MG tablet Take 25 mg by mouth 2 (two) times daily. Pt takes 1/2 am 1/2 pm    . Multiple Vitamins-Minerals (VITEYES AREDS FORMULA) CAPS Take 1 capsule by mouth 2 (two) times daily.    . niacin (NIASPAN) 500 MG CR tablet Take 500 mg by mouth at bedtime.    . nitroGLYCERIN (NITROSTAT) 0.4 MG SL tablet Place 1 tablet (0.4 mg total) under the tongue every 5 (five) minutes as needed. 25 tablet 6  . omeprazole (PRILOSEC OTC) 20 MG tablet Take 20 mg by mouth daily as needed.    Marland Kitchen oxyCODONE (ROXICODONE) 5 MG immediate release tablet Take 1 tablet (5 mg total) by mouth every 6 (six) hours as needed. 20 tablet 0  . tiotropium (SPIRIVA HANDIHALER) 18 MCG inhalation capsule Place 18 mcg into inhaler and inhale daily.     No current facility-administered medications for this visit.   REVIEW OF SYSTEMS: Valu.Nieves ] denotes positive finding; [  ] denotes negative finding  CARDIOVASCULAR:  [ ]  chest pain   [ ]  dyspnea on exertion    CONSTITUTIONAL:  [ ]   fever   [ ]  chills  PHYSICAL EXAM: Filed Vitals:   05/16/15 1354  BP: 105/61  Pulse: 70  Temp: 97.7 F (36.5 C)  TempSrc: Oral  Resp: 16  Height: 5\' 10"  (1.778 m)  Weight: 236 lb (107.049 kg)  SpO2: 98%   GENERAL: The patient is a well-nourished male, in no acute distress. The vital signs are documented above. CARDIOVASCULAR: There is a regular rate and rhythm. PULMONARY: There is good air exchange bilaterally without wheezing or rales. His left neck incision is healed nicely. NEURO: No focal weakness or paresthesias.  MEDICAL ISSUES: This stitch abscess has healed. He has completed his course of Keflex. I'll see him back in 6 months with a carotid duplex scan. He knows to call sooner if he has problems. He is on aspirin and is on a statin.  Deitra Mayo [facility] [place]: [phone]

## 2015-05-16 NOTE — Telephone Encounter (Signed)
Requesting Metoprolol 50mg  #30 to [facility] [address]. Pt has some of this med coming from the [place] soon but need it now from local pharmacy

## 2015-05-16 NOTE — Telephone Encounter (Signed)
SENT 

## 2015-07-19 ENCOUNTER — Ambulatory Visit (INDEPENDENT_AMBULATORY_CARE_PROVIDER_SITE_OTHER): Payer: Medicare Other | Admitting: Family Medicine

## 2015-07-19 ENCOUNTER — Encounter: Payer: Self-pay | Admitting: Family Medicine

## 2015-07-19 VITALS — BP 120/60 | HR 56 | Wt 238.0 lb

## 2015-07-19 DIAGNOSIS — I5022 Chronic systolic (congestive) heart failure: Secondary | ICD-10-CM

## 2015-07-19 DIAGNOSIS — N189 Chronic kidney disease, unspecified: Secondary | ICD-10-CM

## 2015-07-19 DIAGNOSIS — I739 Peripheral vascular disease, unspecified: Secondary | ICD-10-CM

## 2015-07-19 DIAGNOSIS — I10 Essential (primary) hypertension: Secondary | ICD-10-CM | POA: Diagnosis not present

## 2015-07-19 DIAGNOSIS — E669 Obesity, unspecified: Secondary | ICD-10-CM | POA: Diagnosis not present

## 2015-07-19 DIAGNOSIS — R609 Edema, unspecified: Secondary | ICD-10-CM | POA: Diagnosis not present

## 2015-07-19 DIAGNOSIS — I130 Hypertensive heart and chronic kidney disease with heart failure and stage 1 through stage 4 chronic kidney disease, or unspecified chronic kidney disease: Secondary | ICD-10-CM | POA: Diagnosis not present

## 2015-07-19 DIAGNOSIS — E1169 Type 2 diabetes mellitus with other specified complication: Secondary | ICD-10-CM

## 2015-07-19 DIAGNOSIS — E1159 Type 2 diabetes mellitus with other circulatory complications: Secondary | ICD-10-CM

## 2015-07-19 DIAGNOSIS — I509 Heart failure, unspecified: Secondary | ICD-10-CM

## 2015-07-19 DIAGNOSIS — Z136 Encounter for screening for cardiovascular disorders: Secondary | ICD-10-CM | POA: Diagnosis not present

## 2015-07-19 DIAGNOSIS — I2510 Atherosclerotic heart disease of native coronary artery without angina pectoris: Secondary | ICD-10-CM | POA: Diagnosis not present

## 2015-07-19 LAB — CBC WITH DIFFERENTIAL/PLATELET
BASOS ABS: 0 10*3/uL (ref 0.0–0.1)
Basophils Relative: 0 % (ref 0–1)
EOS PCT: 3 % (ref 0–5)
Eosinophils Absolute: 0.3 10*3/uL (ref 0.0–0.7)
HEMATOCRIT: 38 % — AB (ref 39.0–52.0)
Hemoglobin: 12.4 g/dL — ABNORMAL LOW (ref 13.0–17.0)
Lymphocytes Relative: 27 % (ref 12–46)
Lymphs Abs: 2.8 10*3/uL (ref 0.7–4.0)
MCH: 30.8 pg (ref 26.0–34.0)
MCHC: 32.6 g/dL (ref 30.0–36.0)
MCV: 94.5 fL (ref 78.0–100.0)
MPV: 8.8 fL (ref 8.6–12.4)
Monocytes Absolute: 0.9 10*3/uL (ref 0.1–1.0)
Monocytes Relative: 9 % (ref 3–12)
NEUTROS PCT: 61 % (ref 43–77)
Neutro Abs: 6.3 10*3/uL (ref 1.7–7.7)
Platelets: 191 10*3/uL (ref 150–400)
RBC: 4.02 MIL/uL — AB (ref 4.22–5.81)
RDW: 14.5 % (ref 11.5–15.5)
WBC: 10.3 10*3/uL (ref 4.0–10.5)

## 2015-07-19 LAB — COMPREHENSIVE METABOLIC PANEL
ALBUMIN: 3.4 g/dL — AB (ref 3.6–5.1)
ALT: 10 U/L (ref 9–46)
AST: 14 U/L (ref 10–35)
Alkaline Phosphatase: 77 U/L (ref 40–115)
BUN: 17 mg/dL (ref 7–25)
CO2: 24 mmol/L (ref 20–31)
Calcium: 8.8 mg/dL (ref 8.6–10.3)
Chloride: 99 mmol/L (ref 98–110)
Creat: 1.15 mg/dL — ABNORMAL HIGH (ref 0.70–1.11)
Glucose, Bld: 116 mg/dL — ABNORMAL HIGH (ref 65–99)
POTASSIUM: 4.7 mmol/L (ref 3.5–5.3)
Sodium: 134 mmol/L — ABNORMAL LOW (ref 135–146)
Total Bilirubin: 0.4 mg/dL (ref 0.2–1.2)
Total Protein: 6.6 g/dL (ref 6.1–8.1)

## 2015-07-19 MED ORDER — HYDROCHLOROTHIAZIDE 12.5 MG PO CAPS: 12.5000 mg | ORAL_CAPSULE | Freq: Every day | ORAL | Status: DC

## 2015-07-19 NOTE — Progress Notes (Signed)
   Subjective:    Patient ID: [name], male    DOB: [birth year], 81 y.o.   MRN: [id number]  HPI He is here today for evaluation of bilateral lower extremity edema. He states that this has been there for 2 weeks. He apparently has been sleeping in a recliner stating it is uncomfortable for him to lay down. He initially complained of numbness in his legs and then change this to an uncomfortable feeling. He then states that he has had this uncomfortable feeling for over a year. It is apparently intermittent in nature although getting a good history from him was almost impossible since he kept changing his story. He does not complain of chest pain, DOE or PND. He is followed at the [place] and is in the process of switching to being cared for in [place]. He does have underlying heart disease as well as hypertension and a history of diabetes. There is also previous history of PVD.   Review of Systems     Objective:   Physical Exam Alert and in no distress. Cardiac exam showed distant heart sounds without murmurs or gallops lungs are clear to auscultation. Lower extremity exam does show 3+ pitting edema. EKG showed no changes from previous tracing.      Assessment & Plan:  Peripheral edema - Plan: EKG 12-Lead, CBC with Differential/Platelet, Comprehensive metabolic panel, Brain natriuretic peptide, hydrochlorothiazide (MICROZIDE) 12.5 MG capsule  Atherosclerosis of native coronary artery of native heart without angina pectoris - Plan: EKG 12-Lead, CBC with Differential/Platelet, Comprehensive metabolic panel, Brain natriuretic peptide  Peripheral vascular disease  Hypertension associated with diabetes  ASHD (arteriosclerotic heart disease)  Obesity (BMI 30-39.9)  very difficult to get a good history from him to determine exactly what is going on. I will therefore get blood work and probably refer to cardiology.

## 2015-07-20 LAB — BRAIN NATRIURETIC PEPTIDE: Brain Natriuretic Peptide: 348.1 pg/mL — ABNORMAL HIGH (ref 0.0–100.0)

## 2015-07-23 ENCOUNTER — Telehealth: Payer: Self-pay

## 2015-07-23 NOTE — Telephone Encounter (Signed)
Pt called stating he was suppose to have a cardio referral  appt set up. He was wanting to know who he was suppose to see.

## 2015-07-23 NOTE — Telephone Encounter (Signed)
Pt informed of appointment.

## 2015-07-23 NOTE — Telephone Encounter (Signed)
Pt advised of 10;45 arrival and a 11 am appointment with Dr.arida

## 2015-07-24 ENCOUNTER — Encounter: Payer: Self-pay | Admitting: Cardiovascular Disease

## 2015-07-24 ENCOUNTER — Ambulatory Visit (INDEPENDENT_AMBULATORY_CARE_PROVIDER_SITE_OTHER): Payer: Medicare Other | Admitting: Cardiovascular Disease

## 2015-07-24 VITALS — BP 114/60 | HR 64 | Ht 70.0 in | Wt 236.8 lb

## 2015-07-24 DIAGNOSIS — I739 Peripheral vascular disease, unspecified: Secondary | ICD-10-CM | POA: Diagnosis not present

## 2015-07-24 DIAGNOSIS — R0602 Shortness of breath: Secondary | ICD-10-CM | POA: Diagnosis not present

## 2015-07-24 DIAGNOSIS — I5022 Chronic systolic (congestive) heart failure: Secondary | ICD-10-CM

## 2015-07-24 DIAGNOSIS — I2510 Atherosclerotic heart disease of native coronary artery without angina pectoris: Secondary | ICD-10-CM | POA: Diagnosis not present

## 2015-07-24 MED ORDER — POTASSIUM CHLORIDE CRYS ER 20 MEQ PO TBCR: 20.0000 meq | EXTENDED_RELEASE_TABLET | Freq: Every day | ORAL | Status: AC

## 2015-07-24 MED ORDER — FUROSEMIDE 20 MG PO TABS: 20.0000 mg | ORAL_TABLET | Freq: Two times a day (BID) | ORAL | Status: DC

## 2015-07-24 NOTE — Patient Instructions (Addendum)
Medication Instructions:  Your physician has recommended you make the following change in your medication:  1) STOP Hydrochlorothiazide 2) START Lasix 20 mg tablet by mouth TWICE DAILY 3) START Potassium Chloride 20 mEq tablet ONCE daily   Labwork: Your physician recommends that you return for lab work in: 1 week (BMET) 8/17 ANYTIME BETWEEN 7:30AM - 5PM   Testing/Procedures: NONE  Follow-Up: Your physician wants you to follow-up in: 6 months with Dr. Fletcher Anon. You will receive a reminder letter in the mail two months in advance. If you don't receive a letter, please call our office to schedule the follow-up appointment.   Any Other Special Instructions Will Be Listed Below (If Applicable).

## 2015-07-26 ENCOUNTER — Encounter: Payer: Self-pay | Admitting: Family Medicine

## 2015-07-28 DIAGNOSIS — I5022 Chronic systolic (congestive) heart failure: Secondary | ICD-10-CM | POA: Insufficient documentation

## 2015-07-28 NOTE — Assessment & Plan Note (Signed)
The patient has no claudication. Aortoiliac duplex this year showed patent aortofemoral bypass graft and patent left to right femorofemoral bypass. There was borderline stenosis of the left external iliac artery. This should be monitored. His femoral pulses are normal.

## 2015-07-28 NOTE — Assessment & Plan Note (Signed)
He has no symptoms of angina. Continue medical therapy. 

## 2015-07-28 NOTE — Assessment & Plan Note (Signed)
The patient has worsening leg edema and appears to be fluid overloaded. Thus, I discontinued hydrochlorothiazide and started furosemide 20 mg twice daily. I added small dose potassium. Check basic metabolic profile in one week.

## 2015-07-28 NOTE — Progress Notes (Signed)
Primary cardiologist:Dr. Johnsie Cancel  HPI  This is a 81 year old Caucasian male who is here today for a follow-up visit regarding peripheral vascular disease. He is status post abdominal aortic aneurysmrepair with bilateral common iliac aneurysm repair with an aorto biiliacbypass graft as well as a left aortorenal bypass in 1996. 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 He underwent left-to-right femorofemoral bypass. He has not had any claudication since then.  He has known history of coronary artery disease status post coronary artery bypass grafting in 1996 by Dr.Gerhardt.He is known to have bilateral carotid stenosis worse on the left side. No previous history of stroke. Recent carotid Doppler showed progression of his carotid disease to 60-79% on the right side and greater than 80% on the left side. He underwent left carotid endarterectomy in March by Dr. Scot Dock. Most recent nuclear stress test in Marchshowed partially reversible anterolateral defect consistent with prior infarct and moderate peri-infarct ischemia. Ejection fraction was 34%. I proceeded with cardiac catheterization via the left radial artery which showed:  1. Severe three-vessel coronary artery disease with patent grafts except the SVG to diagonal which is known to be occluded. There was severe anastomosis disease and SVG to OM 2/OM 3. This is at the bifurcation of the graft retrograde and not amenable for PCI. 2. Mildly elevated left ventricular end-diastolic pressure.  He has been doing reasonably well. However, he is having worsening leg edema with slightly increased dyspnea. He was started on hydrochlorothiazide with no significant improvement.      No Known Allergies   Current Outpatient Prescriptions on File Prior to Visit  Medication Sig Dispense  Refill  . acetaminophen (TYLENOL) 325 MG tablet Take 650 mg by mouth every 6 (six) hours as needed for mild pain.    Marland Kitchen aspirin 81 MG tablet Take 81 mg by mouth daily.    Marland Kitchen atorvastatin (LIPITOR) 40 MG tablet Take 20 mg by mouth every evening. Patient taking one half tablet (20mg ) by mouth every evening    . B-D ULTRAFINE III SHORT PEN 31G X 8 MM MISC USE TO INJECT LANTUS INSULIN DAILY 100 each 0  . budesonide-formoterol (SYMBICORT) 160-4.5 MCG/ACT inhaler Inhale 2 puffs into the lungs 2 (two) times daily.    . cyanocobalamin (,VITAMIN B-12,) 1000 MCG/ML injection Inject 1,000 mcg into the muscle every 30 (thirty) days.    . enalapril (VASOTEC) 5 MG tablet Take 2.5 mg by mouth daily.    . insulin glargine (LANTUS) 100 UNIT/ML injection Inject 55 Units into the skin at bedtime.     . metFORMIN (GLUCOPHAGE) 1000 MG tablet Take 500 mg by mouth 2 (two) times daily with a meal. (VA CHANGED)Taking one half tablet in the morning by mouth and taking one half tablet by mouth in the evening    . metoprolol (LOPRESSOR) 50 MG tablet Take 0.5 tablets (25 mg total) by mouth 2 (two) times daily. Pt takes 1/2 am 1/2 pm 30 tablet 0  . Multiple Vitamins-Minerals (VITEYES AREDS FORMULA) CAPS Take 1 capsule by mouth 2 (two) times daily.    . niacin (NIASPAN) 500 MG CR tablet Take 500 mg by mouth at bedtime.    . nitroGLYCERIN (NITROSTAT) 0.4 MG SL tablet Place 1 tablet (0.4 mg total) under the tongue every 5 (five) minutes as needed. 25 tablet 6  . omeprazole (PRILOSEC OTC) 20 MG tablet Take 20 mg by mouth daily  as needed.    . tiotropium (SPIRIVA HANDIHALER) 18 MCG inhalation capsule Place 18 mcg into inhaler and inhale daily.     No current facility-administered medications on file prior to visit.     Past Medical History  Diagnosis Date  . CAD (coronary artery disease)   . Renal artery atherosclerosis   . AAA (abdominal aortic aneurysm)   . Carotid artery stenosis   . History of ASCVD (atherosclerotic  cardiovascular disease)   . HTN (hypertension)   . Arterial stenosis   . PVD (peripheral vascular disease)   . HLD (hyperlipidemia)   . DM (diabetes mellitus)   . COPD (chronic obstructive pulmonary disease)   . Shortness of breath dyspnea     with exertion  . GERD (gastroesophageal reflux disease)   . Multiple gastric ulcers     hx of  . Numbness of hand     right     Past Surgical History  Procedure Laterality Date  . Femoral artery aneurysm repair  01/14/05  . Femoral-femoral bypass graft  01/14/05  . Tonsillectomy    . Basal cell carcinoma excision  1994  . Coronary artery bypass graft    . Left heart catheterization with coronary/graft angiogram N/A 02/14/2015    Procedure: LEFT HEART CATHETERIZATION WITH Beatrix Fetters;  Surgeon: Wellington Hampshire, MD;  Location: [facility] CATH LAB;  Service: Cardiovascular;  Laterality: N/A;  . Abdominal aortic aneurysm repair    . Colonoscopy    . Eye surgery Bilateral     cataract  . Endarterectomy Left 03/13/2015    Procedure: Left carotid endarterectomy with patch angioplasty and resection of redundant carotid artery;  Surgeon: Angelia Mould, MD;  Location: [facility];  Service: Vascular;  Laterality: Left;  . Patch angioplasty Left 03/13/2015    Procedure: PATCH ANGIOPLASTY;  Surgeon: Angelia Mould, MD;  Location: [facility] OR;  Service: Vascular;  Laterality: Left;  . Carotid endarterectomy       Family History  Problem Relation Age of Onset  . Melanoma    . Heart disease    . Alcohol abuse    . Diabetes       Social History   Social History  . Marital Status: Widowed    Spouse Name: N/A  . Number of Children: N/A  . Years of Education: N/A   Occupational History  . Not on file.   Social History Main Topics  . Smoking status: Former Research scientist (life sciences)  . Smokeless tobacco: Not on file  . Alcohol Use: No  . Drug Use: No  . Sexual Activity: Not Currently   Other Topics Concern  . Not on file   Social History  Narrative     ROS A 10 point review of system was performed. It is negative other than that mentioned in the history of present illness.   PHYSICAL EXAM   BP 114/60 mmHg  Pulse 64  Ht 5\' 10"  (1.778 m)  Wt 236 lb 12.8 oz (107.412 kg)  BMI 33.98 kg/m2 Constitutional: He is oriented to person, place, and time. He appears well-developed and well-nourished. No distress.  HENT: No nasal discharge.  Head: Normocephalic and atraumatic.  Eyes: Pupils are equal and round.  No discharge. Neck: Normal range of motion. Neck supple. No JVD present. No thyromegaly present.  Cardiovascular: Normal rate, regular rhythm, normal heart sounds. Exam reveals no gallop and no friction rub. No murmur heard.  Pulmonary/Chest: Effort normal and breath sounds normal. No stridor. No respiratory distress. He  has no wheezes. He has no rales. He exhibits no tenderness.  Abdominal: Soft. Bowel sounds are normal. He exhibits no distension. There is no tenderness. There is no rebound and no guarding.  Musculoskeletal: Normal range of motion. He exhibits +2 edema and no tenderness.  Neurological: He is alert and oriented to person, place, and time. Coordination normal.  Skin: Skin is warm and dry. No rash noted. He is not diaphoretic. No erythema. No pallor.  Psychiatric: He has a normal mood and affect. His behavior is normal. Judgment and thought content normal.  Vascular: Radial pulses are normal bilaterally. Femoral pulses are normal bilaterally. Distal pulses are diminished.     ASSESSMENT AND PLAN

## 2015-08-01 ENCOUNTER — Other Ambulatory Visit (INDEPENDENT_AMBULATORY_CARE_PROVIDER_SITE_OTHER): Payer: Medicare Other | Admitting: *Deleted

## 2015-08-01 DIAGNOSIS — R0602 Shortness of breath: Secondary | ICD-10-CM | POA: Diagnosis not present

## 2015-08-01 DIAGNOSIS — I739 Peripheral vascular disease, unspecified: Secondary | ICD-10-CM

## 2015-08-01 LAB — BASIC METABOLIC PANEL
BUN: 30 mg/dL — AB (ref 6–23)
CHLORIDE: 98 meq/L (ref 96–112)
CO2: 28 mEq/L (ref 19–32)
Calcium: 8.8 mg/dL (ref 8.4–10.5)
Creatinine, Ser: 1.51 mg/dL — ABNORMAL HIGH (ref 0.40–1.50)
GFR: 47.08 mL/min — ABNORMAL LOW (ref >60.00)
Glucose, Bld: 284 mg/dL — ABNORMAL HIGH (ref 70–99)
POTASSIUM: 4.5 meq/L (ref 3.5–5.1)
Sodium: 133 mEq/L — ABNORMAL LOW (ref 135–145)

## 2015-08-01 NOTE — Addendum Note (Signed)
Addended by: Eulis Foster on: 08/01/2015 10:21 AM   Modules accepted: Orders

## 2015-08-02 ENCOUNTER — Other Ambulatory Visit: Payer: Self-pay

## 2015-08-02 DIAGNOSIS — I739 Peripheral vascular disease, unspecified: Secondary | ICD-10-CM

## 2015-08-02 DIAGNOSIS — R0602 Shortness of breath: Secondary | ICD-10-CM

## 2015-08-02 MED ORDER — FUROSEMIDE 20 MG PO TABS: 20.0000 mg | ORAL_TABLET | Freq: Every day | ORAL | Status: DC

## 2015-08-16 ENCOUNTER — Other Ambulatory Visit: Payer: Medicare Other

## 2015-08-16 ENCOUNTER — Other Ambulatory Visit (INDEPENDENT_AMBULATORY_CARE_PROVIDER_SITE_OTHER): Payer: Medicare Other | Admitting: *Deleted

## 2015-08-16 DIAGNOSIS — R0602 Shortness of breath: Secondary | ICD-10-CM | POA: Diagnosis not present

## 2015-08-16 DIAGNOSIS — I739 Peripheral vascular disease, unspecified: Secondary | ICD-10-CM

## 2015-08-16 LAB — BASIC METABOLIC PANEL
BUN: 24 mg/dL — ABNORMAL HIGH (ref 6–23)
CHLORIDE: 101 meq/L (ref 96–112)
CO2: 25 mEq/L (ref 19–32)
Calcium: 8.6 mg/dL (ref 8.4–10.5)
Creatinine, Ser: 1.35 mg/dL (ref 0.40–1.50)
GFR: 53.57 mL/min — AB (ref >60.00)
Glucose, Bld: 351 mg/dL — ABNORMAL HIGH (ref 70–99)
Potassium: 4.9 mEq/L (ref 3.5–5.1)
SODIUM: 134 meq/L — AB (ref 135–145)

## 2015-08-17 ENCOUNTER — Encounter: Payer: Self-pay | Admitting: Cardiovascular Disease

## 2015-08-17 NOTE — Telephone Encounter (Signed)
New Message   Pt calling to get results please call pt

## 2015-08-17 NOTE — Telephone Encounter (Signed)
This encounter was created in error - please disregard.

## 2015-09-04 ENCOUNTER — Encounter: Payer: Self-pay | Admitting: Pharmacist

## 2015-09-12 ENCOUNTER — Telehealth: Payer: Self-pay

## 2015-09-12 NOTE — Telephone Encounter (Signed)
LM to CB and schedule a Diabetic f/u

## 2015-09-25 ENCOUNTER — Encounter: Payer: Self-pay | Admitting: Vascular Surgery

## 2015-09-26 ENCOUNTER — Ambulatory Visit (HOSPITAL_COMMUNITY)
Admission: RE | Admit: 2015-09-26 | Discharge: 2015-09-26 | Disposition: A | Discharge status: Home / Self Care | Payer: Medicare Other | Source: Ambulatory Visit | Attending: Vascular Surgery | Admitting: Vascular Surgery

## 2015-09-26 ENCOUNTER — Encounter: Payer: Self-pay | Admitting: Vascular Surgery

## 2015-09-26 ENCOUNTER — Ambulatory Visit (INDEPENDENT_AMBULATORY_CARE_PROVIDER_SITE_OTHER): Payer: Medicare Other | Admitting: Vascular Surgery

## 2015-09-26 VITALS — BP 118/71 | HR 63 | Temp 97.1°F | Resp 18 | Ht 70.0 in | Wt 233.0 lb

## 2015-09-26 DIAGNOSIS — Z48812 Encounter for surgical aftercare following surgery on the circulatory system: Secondary | ICD-10-CM

## 2015-09-26 DIAGNOSIS — I779 Disorder of arteries and arterioles, unspecified: Secondary | ICD-10-CM | POA: Insufficient documentation

## 2015-09-26 DIAGNOSIS — E785 Hyperlipidemia, unspecified: Secondary | ICD-10-CM | POA: Diagnosis not present

## 2015-09-26 DIAGNOSIS — I10 Essential (primary) hypertension: Secondary | ICD-10-CM | POA: Insufficient documentation

## 2015-09-26 DIAGNOSIS — I6523 Occlusion and stenosis of bilateral carotid arteries: Secondary | ICD-10-CM

## 2015-09-26 DIAGNOSIS — I2510 Atherosclerotic heart disease of native coronary artery without angina pectoris: Secondary | ICD-10-CM | POA: Insufficient documentation

## 2015-09-26 DIAGNOSIS — E119 Type 2 diabetes mellitus without complications: Secondary | ICD-10-CM | POA: Diagnosis not present

## 2015-09-26 NOTE — Progress Notes (Signed)
[facility] [place] HISTORY AND PHYSICAL   History of Present Illness:  Patient is a 81 y.o. year old male who presents for evaluation of Bilteral Carotid stenosis f/U.  He underwent a left carotid endarterectomy for a greater than 80% left carotid stenosis. Also he had resection of redundant left common carotid artery and Dacron patch angioplasty.  He reports no changes in vision, no weakness in his extremities and no difficulties with speech or swallowing.  He has chronic LE edema without history of ulcers.  He was placed on Lasix by his Cardiologist and stopped it 2 weeks ago due to back pain.    Other medical problems include has DM; CAD, NATIVE VESSEL; ATHEROSCLEROTIC CARDIOVASCULAR DISEASE; RENAL ARTERY ATHEROSCLEROSIS; Peripheral vascular disease ([place]); Bilateral carotid artery stenosis; Diabetes mellitus due to underlying condition with diabetic neuropathy ([place]); Hypertension associated with diabetes ([place]); Hyperlipidemia LDL goal <70; ASHD (arteriosclerotic heart disease); Obesity (BMI 30-39.9); and Chronic systolic heart failure ([place]) on his problem list.  Past Medical History  Diagnosis Date  . CAD (coronary artery disease)   . Renal artery atherosclerosis ([place])   . AAA (abdominal aortic aneurysm) ([place])   . Carotid artery stenosis   . History of ASCVD (atherosclerotic cardiovascular disease)   . HTN (hypertension)   . Arterial stenosis ([place])   . PVD (peripheral vascular disease) ([place])   . HLD (hyperlipidemia)   . DM (diabetes mellitus) ([place])   . COPD (chronic obstructive pulmonary disease) ([place])   . Shortness of breath dyspnea     with exertion  . GERD (gastroesophageal reflux disease)   . Multiple gastric ulcers     hx of  . Numbness of hand     right    Past Surgical History  Procedure Laterality Date  . Femoral artery aneurysm repair  01/14/05  . Femoral-femoral bypass graft  01/14/05  . Tonsillectomy    . Basal cell carcinoma excision  1994  .  Coronary artery bypass graft    . Left heart catheterization with coronary/graft angiogram N/A 02/14/2015    Procedure: LEFT HEART CATHETERIZATION WITH Beatrix Fetters;  Surgeon: Wellington Hampshire, MD;  Location: [facility] CATH LAB;  Service: Cardiovascular;  Laterality: N/A;  . Abdominal aortic aneurysm repair    . Colonoscopy    . Eye surgery Bilateral     cataract  . Endarterectomy Left 03/13/2015    Procedure: Left carotid endarterectomy with patch angioplasty and resection of redundant carotid artery;  Surgeon: Angelia Mould, MD;  Location: [facility];  Service: Vascular;  Laterality: Left;  . Patch angioplasty Left 03/13/2015    Procedure: PATCH ANGIOPLASTY;  Surgeon: Angelia Mould, MD;  Location: [facility] OR;  Service: Vascular;  Laterality: Left;  . Carotid endarterectomy      Social History Social History  Substance Use Topics  . Smoking status: Former Smoker    Quit date: 09/25/1985  . Smokeless tobacco: Never Used  . Alcohol Use: No    Family History Family History  Problem Relation Age of Onset  . Melanoma    . Heart disease    . Alcohol abuse    . Diabetes      Allergies  No Known Allergies   Current Outpatient Prescriptions  Medication Sig Dispense Refill  . acetaminophen (TYLENOL) 325 MG tablet Take 650 mg by mouth every 6 (six) hours as needed for mild pain.    Marland Kitchen aspirin 81 MG tablet Take 81 mg by mouth daily.    Marland Kitchen  atorvastatin (LIPITOR) 40 MG tablet Take 20 mg by mouth every evening. Patient taking one half tablet (20mg ) by mouth every evening    . B-D ULTRAFINE III SHORT PEN 31G X 8 MM MISC USE TO INJECT LANTUS INSULIN DAILY 100 each 0  . budesonide-formoterol (SYMBICORT) 160-4.5 MCG/ACT inhaler Inhale 2 puffs into the lungs 2 (two) times daily.    . cyanocobalamin (,VITAMIN B-12,) 1000 MCG/ML injection Inject 1,000 mcg into the muscle every 30 (thirty) days.    . enalapril (VASOTEC) 5 MG tablet Take 2.5 mg by mouth daily.    . insulin glargine  (LANTUS) 100 UNIT/ML injection Inject 55 Units into the skin at bedtime.     . metFORMIN (GLUCOPHAGE) 1000 MG tablet Take 500 mg by mouth 2 (two) times daily with a meal. (VA CHANGED)Taking one half tablet in the morning by mouth and taking one half tablet by mouth in the evening    . metoprolol (LOPRESSOR) 50 MG tablet Take 0.5 tablets (25 mg total) by mouth 2 (two) times daily. Pt takes 1/2 am 1/2 pm 30 tablet 0  . Multiple Vitamins-Minerals (VITEYES AREDS FORMULA) CAPS Take 1 capsule by mouth 2 (two) times daily.    . niacin (NIASPAN) 500 MG CR tablet Take 500 mg by mouth at bedtime.    . nitroGLYCERIN (NITROSTAT) 0.4 MG SL tablet Place 1 tablet (0.4 mg total) under the tongue every 5 (five) minutes as needed. 25 tablet 6  . omeprazole (PRILOSEC OTC) 20 MG tablet Take 20 mg by mouth daily as needed.    . potassium chloride SA (K-DUR,KLOR-CON) 20 MEQ tablet Take 1 tablet (20 mEq total) by mouth daily. 90 tablet 3  . tiotropium (SPIRIVA HANDIHALER) 18 MCG inhalation capsule Place 18 mcg into inhaler and inhale daily.    . furosemide (LASIX) 20 MG tablet Take 1 tablet (20 mg total) by mouth daily. (Patient not taking: Reported on 09/26/2015) 90 tablet 3   No current facility-administered medications for this visit.    ROS:   General:  No weight loss, Fever, chills  HEENT: No recent headaches, no nasal bleeding, no visual changes, no sore throat  Neurologic: No dizziness, blackouts, seizures. No recent symptoms of stroke or mini- stroke. No recent episodes of slurred speech, or temporary blindness.  Cardiac: No recent episodes of chest pain/pressure, no shortness of breath at rest.  No shortness of breath with exertion.  Denies history of atrial fibrillation or irregular heartbeat  Vascular: No history of rest pain in feet.  No history of claudication.  No history of non-healing ulcer, No history of DVT   Pulmonary: No home oxygen, no productive cough, no hemoptysis,  No asthma or  wheezing  Musculoskeletal:  [ ]  Arthritis, [ ]  Low back pain,  [ ]  Joint pain  Hematologic:No history of hypercoagulable state.  No history of easy bleeding.  No history of anemia  Gastrointestinal: No hematochezia or melena,  No gastroesophageal reflux, no trouble swallowing  Urinary: [ ]  chronic Kidney disease, [ ]  on HD - [ ]  MWF or [ ]  TTHS, [ ]  Burning with urination, [ ]  Frequent urination, [ ]  Difficulty urinating;   Skin: No rashes  Psychological: No history of anxiety,  No history of depression   Physical Examination  Filed Vitals:   09/26/15 1354 09/26/15 1356  BP: 111/62 118/71  Pulse: 87 63  Temp: 97.1 F (36.2 C)   Resp: 18   Height: 5\' 10"  (1.778 m)   Weight: 233 lb (  105.688 kg)   SpO2: 97%     Body mass index is 33.43 kg/(m^2).  General:  Alert and oriented, no acute distress HEENT: Normal Neck: No bruit, well healed left neck incision Pulmonary: Clear to auscultation bilaterally Cardiac: Regular Rate and Rhythm without murmur Abdomen: Soft, non-tender, non-distended, no mass, no scars Skin: No rash Extremity Pulses:  2+ radial, brachial, femoral,  posterior tibial pulses bilaterally Musculoskeletal: No deformity.  Positive pitting edema L> R lower extremities  Neurologic: Upper and lower extremity motor 5/5 and symmetric  DATA:   Carotid duplex Patent left CEA Right < 39% stenosis   ASSESSMENT:  S/P left CEA 03/13/2015 Pitting edema LE L>R  PLAN:  He will f/U in 6 months for repeat carotid duplex. We showed him elevation positioning for his edema and suggested he elevate his feet at least 3 times daily.  He was also given a prescription for 15-20 mmhg compression knee high stockings.  He will call his cardiologist office Dr. Fletcher Anon to let him know he has stopped the Lasix.   Theda Sers, Ivette Castronova MAUREEN PA-C [facility] [place]  Seen in conjunction with Dr. Scot Dock

## 2015-09-27 NOTE — Addendum Note (Signed)
Addended by: Dorthula Rue L on: 09/27/2015 11:39 AM   Modules accepted: Orders

## 2015-11-28 DIAGNOSIS — Z961 Presence of intraocular lens: Secondary | ICD-10-CM | POA: Diagnosis not present

## 2015-11-28 DIAGNOSIS — H353211 Exudative age-related macular degeneration, right eye, with active choroidal neovascularization: Secondary | ICD-10-CM | POA: Diagnosis not present

## 2015-11-28 DIAGNOSIS — H04123 Dry eye syndrome of bilateral lacrimal glands: Secondary | ICD-10-CM | POA: Diagnosis not present

## 2015-11-28 DIAGNOSIS — H353121 Nonexudative age-related macular degeneration, left eye, early dry stage: Secondary | ICD-10-CM | POA: Diagnosis not present

## 2015-11-28 DIAGNOSIS — H40013 Open angle with borderline findings, low risk, bilateral: Secondary | ICD-10-CM | POA: Diagnosis not present

## 2015-11-29 ENCOUNTER — Encounter (INDEPENDENT_AMBULATORY_CARE_PROVIDER_SITE_OTHER): Payer: Medicare Other | Admitting: Ophthalmology

## 2015-11-29 DIAGNOSIS — H353134 Nonexudative age-related macular degeneration, bilateral, advanced atrophic with subfoveal involvement: Secondary | ICD-10-CM | POA: Diagnosis not present

## 2015-11-29 DIAGNOSIS — H43813 Vitreous degeneration, bilateral: Secondary | ICD-10-CM | POA: Diagnosis not present

## 2015-11-29 DIAGNOSIS — H35033 Hypertensive retinopathy, bilateral: Secondary | ICD-10-CM

## 2015-11-29 DIAGNOSIS — I10 Essential (primary) hypertension: Secondary | ICD-10-CM | POA: Diagnosis not present

## 2015-12-21 ENCOUNTER — Telehealth: Payer: Self-pay | Admitting: Family Medicine

## 2015-12-21 ENCOUNTER — Other Ambulatory Visit: Payer: Self-pay | Admitting: Family Medicine

## 2015-12-21 MED ORDER — METOPROLOL TARTRATE 50 MG PO TABS: 25.0000 mg | ORAL_TABLET | Freq: Two times a day (BID) | ORAL | Status: DC

## 2015-12-21 NOTE — Telephone Encounter (Signed)
Pt called and needs refill of Metoprolol 50 mg to [facility], [place] [phone].  He has moved to [place] with his son.  He will come in a couple weeks for med check has been scheduled

## 2016-01-01 ENCOUNTER — Encounter: Payer: Self-pay | Admitting: Family Medicine

## 2016-01-01 ENCOUNTER — Ambulatory Visit (INDEPENDENT_AMBULATORY_CARE_PROVIDER_SITE_OTHER): Payer: Medicare Other | Admitting: Family Medicine

## 2016-01-01 VITALS — BP 110/68 | HR 68 | Temp 97.7°F | Ht 70.0 in | Wt 231.2 lb

## 2016-01-01 DIAGNOSIS — E0840 Diabetes mellitus due to underlying condition with diabetic neuropathy, unspecified: Secondary | ICD-10-CM | POA: Diagnosis not present

## 2016-01-01 DIAGNOSIS — N5201 Erectile dysfunction due to arterial insufficiency: Secondary | ICD-10-CM | POA: Diagnosis not present

## 2016-01-01 DIAGNOSIS — I10 Essential (primary) hypertension: Secondary | ICD-10-CM

## 2016-01-01 DIAGNOSIS — E785 Hyperlipidemia, unspecified: Secondary | ICD-10-CM

## 2016-01-01 DIAGNOSIS — E1159 Type 2 diabetes mellitus with other circulatory complications: Secondary | ICD-10-CM | POA: Diagnosis not present

## 2016-01-01 DIAGNOSIS — Z9889 Other specified postprocedural states: Secondary | ICD-10-CM | POA: Diagnosis not present

## 2016-01-01 DIAGNOSIS — I2510 Atherosclerotic heart disease of native coronary artery without angina pectoris: Secondary | ICD-10-CM

## 2016-01-01 DIAGNOSIS — Z794 Long term (current) use of insulin: Secondary | ICD-10-CM

## 2016-01-01 DIAGNOSIS — E118 Type 2 diabetes mellitus with unspecified complications: Secondary | ICD-10-CM

## 2016-01-01 DIAGNOSIS — E1169 Type 2 diabetes mellitus with other specified complication: Secondary | ICD-10-CM

## 2016-01-01 DIAGNOSIS — Z23 Encounter for immunization: Secondary | ICD-10-CM | POA: Diagnosis not present

## 2016-01-01 LAB — CBC WITH DIFFERENTIAL/PLATELET
BASOS PCT: 0 % (ref 0–1)
Basophils Absolute: 0 10*3/uL (ref 0.0–0.1)
Eosinophils Absolute: 0.3 10*3/uL (ref 0.0–0.7)
Eosinophils Relative: 3 % (ref 0–5)
HEMATOCRIT: 38.9 % — AB (ref 39.0–52.0)
HEMOGLOBIN: 12.8 g/dL — AB (ref 13.0–17.0)
LYMPHS PCT: 24 % (ref 12–46)
Lymphs Abs: 2.6 10*3/uL (ref 0.7–4.0)
MCH: 32.2 pg (ref 26.0–34.0)
MCHC: 32.9 g/dL (ref 30.0–36.0)
MCV: 98 fL (ref 78.0–100.0)
MONOS PCT: 10 % (ref 3–12)
MPV: 9.1 fL (ref 8.6–12.4)
Monocytes Absolute: 1.1 10*3/uL — ABNORMAL HIGH (ref 0.1–1.0)
NEUTROS ABS: 6.9 10*3/uL (ref 1.7–7.7)
NEUTROS PCT: 63 % (ref 43–77)
Platelets: 175 10*3/uL (ref 150–400)
RBC: 3.97 MIL/uL — ABNORMAL LOW (ref 4.22–5.81)
RDW: 14.4 % (ref 11.5–15.5)
WBC: 11 10*3/uL — ABNORMAL HIGH (ref 4.0–10.5)

## 2016-01-01 LAB — LIPID PANEL
CHOL/HDL RATIO: 6.6 ratio — AB (ref <=5.0)
CHOLESTEROL: 179 mg/dL (ref 125–200)
HDL: 27 mg/dL — ABNORMAL LOW (ref >=40)
LDL CALC: 115 mg/dL (ref <130)
TRIGLYCERIDES: 183 mg/dL — AB (ref <150)
VLDL: 37 mg/dL — AB (ref <30)

## 2016-01-01 LAB — POCT GLYCOSYLATED HEMOGLOBIN (HGB A1C): HEMOGLOBIN A1C: 8.9

## 2016-01-01 MED ORDER — SILDENAFIL CITRATE 50 MG PO TABS: 50.0000 mg | ORAL_TABLET | Freq: Every day | ORAL | Status: AC | PRN

## 2016-01-01 NOTE — Progress Notes (Signed)
  Subjective:   [name] is an 82 y.o. male who presents for follow up of Type 2 diabetes mellitus.   Patient is checking home blood sugars.   Home blood sugar records: BGs range between 100 and 200 When he has to 100, he does not do anything about it. Current symptoms include: none. Patient denies foot ulcerations.  Patient is checking their feet daily. Foot concerns (callous, ulcer, wound, thickened nails, toenail fungus, skin fungus, hammer toe): none Last dilated eye exam 12/16.  Current treatments: carotid endarterectomy in March 2016. Medication compliance: fair  Current diet: in general, an "unhealthy" diet Current exercise: none Known diabetic complications: nephropathy, impotence and cardiovascular disease He does not complain of chest pain, shortness of breath. He is scheduled to see his cardiologist in the near future. Socially things are going well. He apparently has a new girlfriend and would like to become sexually active. He has had difficulty with getting and maintaining erections.  The following portions of the patient's history were reviewed and updated as appropriate: allergies, current medications, past family history, past medical history, past social history, past surgical history and problem list.  ROS as in subjective above    Objective:  Alert and in no distress. Hemoglobin A1c is 8.9    Assessment:  Need for prophylactic vaccination and inoculation against influenza - Plan: Flu vaccine HIGH DOSE PF (Fluzone High dose)  Need for prophylactic vaccination against Streptococcus pneumoniae (pneumococcus) - Plan: Pneumococcal conjugate vaccine 13-valent  Cardiovascular disease  Diabetes mellitus due to underlying condition with diabetic neuropathy, with long-term current use of insulin ([name])  Hypertension associated with diabetes ([name])  Hyperlipidemia associated with type 2 diabetes mellitus ([name])  Type 2 diabetes mellitus with complication, with  long-term current use of insulin (H[facility])  S/P carotid endarterectomy  Erectile dysfunction due to arterial insufficiency - Plan: sildenafil (VIAGRA) 50 MG tablet     Plan:   Diabetes Mellitus type 2: Education: Reviewed 'ABCs' of diabetes management (respective goals in parentheses):  A1C (<7), blood pressure (<130/80), and cholesterol (LDL <100)   Diabetes mellitus Type II, under fair control.   Compliance at present is estimated to be poor. Efforts to improve compliance (if necessary) will be directed at increased exercise and regular blood sugar monitoring: 3 times daily.    Blood pressure: normal blood pressure .   An ACE/ARB is  currently part of their treatment regimen.   Dyslipidemia under good control. .  A statin is  currently part of their treatment regimen.   Counseling at today's visit: reminded to check sugars regularly and to bring readings in at the time of the next visit. Discussed foot care. Reminded to get yearly retinal exam. Discussed ways to avoid symptomatic hypoglycemia.  Discussed the possibility of increasing his insulin however strongly encouraged him to make further diet and exercise changes and pay much more attention to his blood sugars especially after meals. He will return here in 2 weeks for follow-up on this. Also discussed the use of Viagra. Discussed proper use of the medication as well as risk factors and side effects. Caution him to not take NTG with this. He states he has not taken this medication in quite some time. He will let me know how works. Over 45 minutes, greater than 50% spent in counseling and coordination of care. Follow up: 2 weeks

## 2016-01-01 NOTE — Addendum Note (Signed)
Addended by: Denita Lung on: 01/01/2016 03:02 PM   Modules accepted: Orders

## 2016-01-01 NOTE — Addendum Note (Signed)
Addended byRoland Rack on: 01/01/2016 02:29 PM   Modules accepted: Orders

## 2016-01-03 ENCOUNTER — Ambulatory Visit (INDEPENDENT_AMBULATORY_CARE_PROVIDER_SITE_OTHER): Payer: Medicare Other | Admitting: Urgent Care

## 2016-01-03 ENCOUNTER — Encounter: Payer: Self-pay | Admitting: Urgent Care

## 2016-01-03 VITALS — BP 117/62 | HR 71 | Temp 98.2°F | Resp 16 | Ht 70.0 in | Wt 233.0 lb

## 2016-01-03 DIAGNOSIS — I152 Hypertension secondary to endocrine disorders: Secondary | ICD-10-CM

## 2016-01-03 DIAGNOSIS — E785 Hyperlipidemia, unspecified: Secondary | ICD-10-CM

## 2016-01-03 DIAGNOSIS — I2510 Atherosclerotic heart disease of native coronary artery without angina pectoris: Secondary | ICD-10-CM | POA: Diagnosis not present

## 2016-01-03 DIAGNOSIS — I10 Essential (primary) hypertension: Secondary | ICD-10-CM

## 2016-01-03 DIAGNOSIS — E1159 Type 2 diabetes mellitus with other circulatory complications: Secondary | ICD-10-CM | POA: Diagnosis not present

## 2016-01-03 DIAGNOSIS — Z794 Long term (current) use of insulin: Secondary | ICD-10-CM

## 2016-01-03 DIAGNOSIS — E0840 Diabetes mellitus due to underlying condition with diabetic neuropathy, unspecified: Secondary | ICD-10-CM | POA: Diagnosis not present

## 2016-01-03 DIAGNOSIS — I739 Peripheral vascular disease, unspecified: Secondary | ICD-10-CM

## 2016-01-03 DIAGNOSIS — E1169 Type 2 diabetes mellitus with other specified complication: Secondary | ICD-10-CM

## 2016-01-03 NOTE — Patient Instructions (Signed)
-   Please keep your appointment with your PCP Dr. Redmond School which he would like to happen in 2 weeks at the beginning of February. - Let me know if he would like me to help you with your medical care.

## 2016-01-03 NOTE — Progress Notes (Signed)
    MRN: [id number] DOB: [birth year]  Subjective:   [name] is a 82 y.o. male presenting for chief complaint of establish care  Reports that patient was told by the [facility] to come in for a visit with me today. He has been [facility]ing between [facility] and is hoping to establish care at the [facility]. He actually has an appointment on 01/15/2016. His PCP is Dr. Redmond School with [facility]. His last visit with him was 01/01/2016 for management of his diagnosis. Medication refills were provided through him. His cardiologist is Dr. Fletcher Anon, last visit was 07/24/2015 and was stable. He reports that he is asymptomatic and doing well today. He also states that he does not know why he was told by the [facility] to come here.  [name] has a current medication list which includes the following prescription(s): acetaminophen, aspirin, atorvastatin, b-d ultrafine iii short pen, budesonide-formoterol, cyanocobalamin, enalapril, insulin glargine, metformin, metoprolol, viteyes areds formula, niacin, nitroglycerin, omeprazole, potassium chloride sa, tiotropium, furosemide, and sildenafil. Also has No Known Allergies.  [name]  has a past medical history of CAD (coronary artery disease); Renal artery atherosclerosis ([place]); AAA (abdominal aortic aneurysm) ([place]); Carotid artery stenosis; History of ASCVD (atherosclerotic cardiovascular disease); HTN (hypertension); Arterial stenosis (H[facility]); PVD (peripheral vascular disease) ([place]); HLD (hyperlipidemia); DM (diabetes mellitus) ([place]); COPD (chronic obstructive pulmonary disease) ([place]); Shortness of breath dyspnea; GERD (gastroesophageal reflux disease); Multiple gastric ulcers; and Numbness of hand. Also  has past surgical history that includes Femoral artery aneurysm repair (01/14/05); Femoral-femoral Bypass Graft (01/14/05); Tonsillectomy; Excision basal cell carcinoma (1994); Coronary artery bypass graft; left heart catheterization with coronary/graft angiogram (N/A, 02/14/2015); Abdominal aortic  aneurysm repair; Colonoscopy; Eye surgery (Bilateral); Endarterectomy (Left, 03/13/2015); Patch angioplasty (Left, 03/13/2015); and Carotid endarterectomy.  Objective:   Vitals: BP 117/62 mmHg  Pulse 71  Temp(Src) 98.2 F (36.8 C) (Oral)  Resp 16  Ht 5\' 10"  (1.778 m)  Wt 233 lb (105.688 kg)  BMI 33.43 kg/m2  SpO2 98%  Physical Exam  Constitutional: He is oriented to person, place, and time. He appears well-developed and well-nourished.  Cardiovascular: Normal rate.   Pulmonary/Chest: Effort normal.  Neurological: He is alert and oriented to person, place, and time.  Psychiatric: He has a normal mood and affect.   Assessment and Plan :   1. Cardiovascular disease 2. Peripheral vascular disease ([place]) 3. Diabetes mellitus due to underlying condition with diabetic neuropathy, with long-term current use of insulin ([place]) 4. Hypertension associated with diabetes ([place]) 5. Hyperlipidemia associated with type 2 diabetes mellitus ([place]) - It is unclear to me why this appointment was set up. Patient denies any symptoms, need for refills. He was just seen by his PCP and cardiologist. He will let me know if he needs anything in the future.   Chad Eagles, PA-C [facility] [phone] 01/03/2016 4:10 PM

## 2016-01-08 ENCOUNTER — Other Ambulatory Visit: Payer: Self-pay | Admitting: Cardiovascular Disease

## 2016-01-08 DIAGNOSIS — I6523 Occlusion and stenosis of bilateral carotid arteries: Secondary | ICD-10-CM

## 2016-01-16 ENCOUNTER — Other Ambulatory Visit: Payer: Self-pay | Admitting: Family Medicine

## 2016-01-23 ENCOUNTER — Ambulatory Visit (HOSPITAL_COMMUNITY)
Admission: RE | Admit: 2016-01-23 | Discharge: 2016-01-23 | Disposition: A | Discharge status: Home / Self Care | Payer: Medicare Other | Source: Ambulatory Visit | Attending: Internal Medicine | Admitting: Internal Medicine

## 2016-01-23 DIAGNOSIS — I10 Essential (primary) hypertension: Secondary | ICD-10-CM | POA: Diagnosis not present

## 2016-01-23 DIAGNOSIS — E785 Hyperlipidemia, unspecified: Secondary | ICD-10-CM | POA: Diagnosis not present

## 2016-01-23 DIAGNOSIS — E119 Type 2 diabetes mellitus without complications: Secondary | ICD-10-CM | POA: Diagnosis not present

## 2016-01-23 DIAGNOSIS — I6523 Occlusion and stenosis of bilateral carotid arteries: Secondary | ICD-10-CM | POA: Diagnosis not present

## 2016-02-01 ENCOUNTER — Telehealth: Payer: Self-pay

## 2016-02-01 DIAGNOSIS — I6523 Occlusion and stenosis of bilateral carotid arteries: Secondary | ICD-10-CM

## 2016-02-01 NOTE — Telephone Encounter (Signed)
-----   Message from Josue Hector, MD sent at 01/24/2016  5:27 PM EST ----- [id number] LICA stenosis.  F/U duplex in one year

## 2016-02-01 NOTE — Telephone Encounter (Signed)
Informed patient of results and verbal understanding expressed.  Repeat carotids ordered to be scheduled in 1 year. Patient agrees with treatment plan. 

## 2016-03-18 ENCOUNTER — Encounter: Payer: Self-pay | Admitting: Vascular Surgery

## 2016-03-26 ENCOUNTER — Telehealth: Payer: Self-pay | Admitting: Family Medicine

## 2016-03-26 ENCOUNTER — Other Ambulatory Visit: Payer: Self-pay

## 2016-03-26 ENCOUNTER — Ambulatory Visit (INDEPENDENT_AMBULATORY_CARE_PROVIDER_SITE_OTHER): Payer: Medicare Other | Admitting: Family

## 2016-03-26 ENCOUNTER — Encounter: Payer: Self-pay | Admitting: Family

## 2016-03-26 ENCOUNTER — Ambulatory Visit (HOSPITAL_COMMUNITY)
Admission: RE | Admit: 2016-03-26 | Discharge: 2016-03-26 | Disposition: A | Discharge status: Home / Self Care | Payer: Medicare Other | Source: Ambulatory Visit | Attending: Vascular Surgery | Admitting: Vascular Surgery

## 2016-03-26 VITALS — BP 128/66 | HR 70 | Temp 98.6°F | Resp 14 | Ht 70.0 in | Wt 229.8 lb

## 2016-03-26 DIAGNOSIS — Z9889 Other specified postprocedural states: Secondary | ICD-10-CM

## 2016-03-26 DIAGNOSIS — I6523 Occlusion and stenosis of bilateral carotid arteries: Secondary | ICD-10-CM

## 2016-03-26 DIAGNOSIS — Z48812 Encounter for surgical aftercare following surgery on the circulatory system: Secondary | ICD-10-CM

## 2016-03-26 NOTE — Progress Notes (Signed)
Chief Complaint: Extracranial Carotid Artery Stenosis   History of Present Illness  [name] is a 82 y.o. male patient of Dr. Scot Dock who is s/p left carotid endarterectomy with Dacron patch angioplasty and resection of redundant left common carotid artery on 03/13/2015. The patient had extensive plaque extending into the common carotid artery and also high bifurcation. He returns today for routine follow up. Pt states he had a CABG and AAA repair about 1997 or 2002. His cardiologist has been monitoring his LE arterial occlusive disease; see duplex results below from 2016.  Pt states since about 2012 he has had intermittent right facial and right arm numbness; states he was tested several times for stroke and states he was told he did not have a stroke. [facility]  The patient denies any other history of TIA or stroke symptoms, specifically the patient denies a history of amaurosis fugax or monocular blindness, and denies a history of receptive or expressive aphasia.    Pt Diabetic: yes, states his last A1C was 8.?, he then states medications and other changes were made and his home glucose has improved to low to mid 100's Pt smoker: former smoker, quit about 1990's  Pt meds include: Statin : yes ASA: yes Other anticoagulants/antiplatelets: no   Past Medical History  Diagnosis Date  . CAD (coronary artery disease)   . Renal artery atherosclerosis ([place])   . AAA (abdominal aortic aneurysm) ([place])   . Carotid artery stenosis   . History of ASCVD (atherosclerotic cardiovascular disease)   . HTN (hypertension)   . Arterial stenosis ([place])   . PVD (peripheral vascular disease) ([place])   . HLD (hyperlipidemia)   . DM (diabetes mellitus) ([place])   . COPD (chronic obstructive pulmonary disease) ([place])   . Shortness of breath dyspnea     with exertion  . GERD (gastroesophageal reflux disease)   . Multiple gastric ulcers     hx of  . Numbness of hand      right    Social History Social History  Substance Use Topics  . Smoking status: Former Smoker    Quit date: 09/25/1985  . Smokeless tobacco: Never Used  . Alcohol Use: No    Family History Family History  Problem Relation Age of Onset  . Melanoma    . Heart disease    . Alcohol abuse    . Diabetes      Surgical History Past Surgical History  Procedure Laterality Date  . Femoral artery aneurysm repair  01/14/05  . Femoral-femoral bypass graft  01/14/05  . Tonsillectomy    . Basal cell carcinoma excision  1994  . Coronary artery bypass graft    . Left heart catheterization with coronary/graft angiogram N/A 02/14/2015    Procedure: LEFT HEART CATHETERIZATION WITH Beatrix Fetters;  Surgeon: Wellington Hampshire, MD;  Location: [facility] CATH LAB;  Service: Cardiovascular;  Laterality: N/A;  . Abdominal aortic aneurysm repair    . Colonoscopy    . Eye surgery Bilateral     cataract  . Endarterectomy Left 03/13/2015    Procedure: Left carotid endarterectomy with patch angioplasty and resection of redundant carotid artery;  Surgeon: Angelia Mould, MD;  Location: [place];  Service: Vascular;  Laterality: Left;  . Patch angioplasty Left 03/13/2015    Procedure: PATCH ANGIOPLASTY;  Surgeon: Angelia Mould, MD;  Location: [place];  Service: Vascular;  Laterality: Left;  . Carotid endarterectomy  No Known Allergies  Current Outpatient Prescriptions  Medication Sig Dispense Refill  . acetaminophen (TYLENOL) 325 MG tablet Take 650 mg by mouth every 6 (six) hours as needed for mild pain.    Marland Kitchen aspirin 81 MG tablet Take 81 mg by mouth daily.    Marland Kitchen atorvastatin (LIPITOR) 40 MG tablet Take 20 mg by mouth every evening. Patient taking one half tablet (20mg ) by mouth every evening    . B-D ULTRAFINE III SHORT PEN 31G X 8 MM MISC USE TO INJECT LANTUS INSULIN DAILY 100 each 0  . budesonide-formoterol (SYMBICORT) 160-4.5 MCG/ACT inhaler Inhale 2 puffs into the lungs 2 (two) times  daily.    . cyanocobalamin (,VITAMIN B-12,) 1000 MCG/ML injection Inject 1,000 mcg into the muscle every 30 (thirty) days.    . enalapril (VASOTEC) 5 MG tablet Take 2.5 mg by mouth daily.    . insulin glargine (LANTUS) 100 UNIT/ML injection Inject 55 Units into the skin at bedtime.     . metFORMIN (GLUCOPHAGE) 1000 MG tablet Take 500 mg by mouth 2 (two) times daily with a meal. (VA CHANGED)Taking one half tablet in the morning by mouth and taking one half tablet by mouth in the evening    . metoprolol (LOPRESSOR) 50 MG tablet TAKE 1/2 TABLET TWICE A DAY (IN AM AND PM) 30 tablet 2  . Multiple Vitamins-Minerals (VITEYES AREDS FORMULA) CAPS Take 1 capsule by mouth 2 (two) times daily.    . niacin (NIASPAN) 500 MG CR tablet Take 500 mg by mouth at bedtime.    . nitroGLYCERIN (NITROSTAT) 0.4 MG SL tablet Place 1 tablet (0.4 mg total) under the tongue every 5 (five) minutes as needed. 25 tablet 6  . omeprazole (PRILOSEC OTC) 20 MG tablet Take 20 mg by mouth daily as needed.    . potassium chloride SA (K-DUR,KLOR-CON) 20 MEQ tablet Take 1 tablet (20 mEq total) by mouth daily. 90 tablet 3  . tiotropium (SPIRIVA HANDIHALER) 18 MCG inhalation capsule Place 18 mcg into inhaler and inhale daily.    . furosemide (LASIX) 20 MG tablet Take 1 tablet (20 mg total) by mouth daily. (Patient not taking: Reported on 01/03/2016) 90 tablet 3  . sildenafil (VIAGRA) 50 MG tablet Take 1 tablet (50 mg total) by mouth daily as needed for erectile dysfunction. (Patient not taking: Reported on 01/03/2016) 2 tablet 0   No current facility-administered medications for this visit.    Review of Systems : See HPI for pertinent positives and negatives.  Physical Examination  Filed Vitals:   03/26/16 1501  BP: 128/66  Pulse: 70  Temp: 98.6 F (37 C)  TempSrc: Oral  Resp: 14  Height: 5\' 10"  (1.778 m)  Weight: 229 lb 12.8 oz (104.237 kg)  SpO2: 98%   Body mass index is 32.97 kg/(m^2).  General: WDWN obese male in  NAD GAIT: normal Eyes: PERRLA Pulmonary:  Non-labored, CTAB, no rales,  rhonchi, or wheezing.  Cardiac: regular rhythm, no detected murmur.  VASCULAR EXAM Carotid Bruits Right Left   Negative Negative    Aorta is not palpable. Radial pulses are 2+ palpable and equal.  LE Pulses Right noeft       POPLITEAL  not palpable   not palpable       POSTERIOR TIBIAL  not palpable   not palpable        DORSALIS PEDIS      ANTERIOR TIBIAL not palpable  not palpable     Gastrointestinal: soft, nontender, BS WNL, no r/g, no palpable masses.  Musculoskeletal: no muscle atrophy/wasting. M/S 5/5 throughout, extremities without ischemic changes. Bilateral 1-2+ pitting edema in ankles.  Neurologic: A&O X 3; Appropriate Affect, Speech is normal CN 2-12 intact, pain and light touch intact in extremities, Motor exam as listed above.   01/23/15 LE arterial Duplex requested by Dr. Johnsie Cancel: Patent left to right common femoral artery bypass graft. 0-49% bilateral SFA disease, without focal stenosis. Occluded bilateral anterior and posterior tibial arteries. One vessel run-off, bilaterally.   Non-Invasive Vascular Imaging CAROTID DUPLEX 01/23/2016 at [facility] requested by Dr. Johnsie Cancel:  Heterogeneous plaque, bilaterally. Progression of RICA stenosis, now in the 40-59% range. Stable 123456 LICA stenosis, s/p CEA with DPA. >50% RECA stenosis. Normal subclavian arteries, bilaterally. Patent vertebral arteries with antegrade flow.      Assessment: [name] is a 82 y.o. male who is s/p left carotid endarterectomy with Dacron patch angioplasty and resection of redundant left common carotid artery on 03/13/2015. The patient had extensive plaque extending into the common carotid artery and also high bifurcation. He has no hx of stroke or TIA, see HPI.  01/23/16 carotid duplex  requested by Dr. Johnsie Cancel and performed at the [facility] facility suggests heterogeneous plaque, bilaterally. Progression of RICA stenosis, now in the 40-59% range. Stable 123456 LICA stenosis, s/p CEA with DPA. >50% RECA stenosis. Normal subclavian arteries, bilaterally. Patent vertebral arteries with antegrade flow.    Pt's cardiologist had been monitoring his PAOD.  Plan: Follow-up in 1 year with Carotid Duplex scan.   I discussed in depth with the patient the nature of atherosclerosis, and emphasized the importance of maximal medical management including strict control of blood pressure, blood glucose, and lipid levels, obtaining regular exercise, and continued cessation of smoking.  The patient is aware that without maximal medical management the underlying atherosclerotic disease process will progress, limiting the benefit of any interventions. The patient was given information about stroke prevention and what symptoms should prompt the patient to seek immediate medical care. Thank you for allowing [place] to participate in this patient's care.  Clemon Chambers, RN, MSN, FNP-C [facility] Office: [phone]  Clinic Physician: Scot Dock  03/26/2016 3:07 PM

## 2016-03-26 NOTE — Telephone Encounter (Signed)
Okay to call in. 

## 2016-03-26 NOTE — Telephone Encounter (Signed)
Pt stopped by and requested a refill of fluocinolone acetonide 0.01% topical oil. He states that this was possible originally given to him by the [place]. He uses this for jock itch. Pt uses [facility] pt can be reached [phone].

## 2016-03-26 NOTE — Patient Instructions (Signed)
Stroke Prevention Some medical conditions and behaviors are associated with an increased chance of having a stroke. You may prevent a stroke by making healthy choices and managing medical conditions. HOW CAN I REDUCE MY RISK OF HAVING A STROKE?   Stay physically active. Get at least 30 minutes of activity on most or all days.  Do not smoke. It may also be helpful to avoid exposure to secondhand smoke.  Limit alcohol use. Moderate alcohol use is considered to be:  No more than 2 drinks per day for men.  No more than 1 drink per day for nonpregnant women.  Eat healthy foods. This involves:  Eating 5 or more servings of fruits and vegetables a day.  Making dietary changes that address high blood pressure (hypertension), high cholesterol, diabetes, or obesity.  Manage your cholesterol levels.  Making food choices that are high in fiber and low in saturated fat, trans fat, and cholesterol may control cholesterol levels.  Take any prescribed medicines to control cholesterol as directed by your health care provider.  Manage your diabetes.  Controlling your carbohydrate and sugar intake is recommended to manage diabetes.  Take any prescribed medicines to control diabetes as directed by your health care provider.  Control your hypertension.  Making food choices that are low in salt (sodium), saturated fat, trans fat, and cholesterol is recommended to manage hypertension.  Ask your health care provider if you need treatment to lower your blood pressure. Take any prescribed medicines to control hypertension as directed by your health care provider.  If you are 18-39 years of age, have your blood pressure checked every 3-5 years. If you are 40 years of age or older, have your blood pressure checked every year.  Maintain a healthy weight.  Reducing calorie intake and making food choices that are low in sodium, saturated fat, trans fat, and cholesterol are recommended to manage  weight.  Stop drug abuse.  Avoid taking birth control pills.  Talk to your health care provider about the risks of taking birth control pills if you are over 35 years old, smoke, get migraines, or have ever had a blood clot.  Get evaluated for sleep disorders (sleep apnea).  Talk to your health care provider about getting a sleep evaluation if you snore a lot or have excessive sleepiness.  Take medicines only as directed by your health care provider.  For some people, aspirin or blood thinners (anticoagulants) are helpful in reducing the risk of forming abnormal blood clots that can lead to stroke. If you have the irregular heart rhythm of atrial fibrillation, you should be on a blood thinner unless there is a good reason you cannot take them.  Understand all your medicine instructions.  Make sure that other conditions (such as anemia or atherosclerosis) are addressed. SEEK IMMEDIATE MEDICAL CARE IF:   You have sudden weakness or numbness of the face, arm, or leg, especially on one side of the body.  Your face or eyelid droops to one side.  You have sudden confusion.  You have trouble speaking (aphasia) or understanding.  You have sudden trouble seeing in one or both eyes.  You have sudden trouble walking.  You have dizziness.  You have a loss of balance or coordination.  You have a sudden, severe headache with no known cause.  You have new chest pain or an irregular heartbeat. Any of these symptoms may represent a serious problem that is an emergency. Do not wait to see if the symptoms will   go away. Get medical help at once. Call your local emergency services (911 in [place] Do not drive yourself to the hospital.   This information is not intended to replace advice given to you by your health care provider. Make sure you discuss any questions you have with your health care provider.   Document Released: 01/08/2005 Document Revised: 12/22/2014 Document Reviewed:  06/03/2013 Elsevier Interactive Patient Education 2016 Elsevier Inc.  

## 2016-03-26 NOTE — Telephone Encounter (Signed)
Called med in per dr.Lalonde

## 2016-03-31 ENCOUNTER — Ambulatory Visit (INDEPENDENT_AMBULATORY_CARE_PROVIDER_SITE_OTHER): Payer: Medicare Other | Admitting: Ophthalmology

## 2016-03-31 DIAGNOSIS — H35033 Hypertensive retinopathy, bilateral: Secondary | ICD-10-CM | POA: Diagnosis not present

## 2016-03-31 DIAGNOSIS — H43813 Vitreous degeneration, bilateral: Secondary | ICD-10-CM

## 2016-03-31 DIAGNOSIS — H353134 Nonexudative age-related macular degeneration, bilateral, advanced atrophic with subfoveal involvement: Secondary | ICD-10-CM | POA: Diagnosis not present

## 2016-03-31 DIAGNOSIS — I10 Essential (primary) hypertension: Secondary | ICD-10-CM

## 2016-04-22 ENCOUNTER — Other Ambulatory Visit: Payer: Self-pay | Admitting: Vascular Surgery

## 2016-04-22 DIAGNOSIS — I739 Peripheral vascular disease, unspecified: Secondary | ICD-10-CM

## 2016-04-22 NOTE — Addendum Note (Signed)
Addended by: Dorothyann Gibbs on: 04/22/2016 03:18 PM   Modules accepted: Orders

## 2016-04-22 NOTE — Addendum Note (Signed)
Addended by: Dorothyann Gibbs on: 04/22/2016 02:46 PM   Modules accepted: Orders

## 2016-04-24 ENCOUNTER — Other Ambulatory Visit: Payer: Self-pay | Admitting: Vascular Surgery

## 2016-05-01 ENCOUNTER — Ambulatory Visit (INDEPENDENT_AMBULATORY_CARE_PROVIDER_SITE_OTHER): Payer: Medicare Other | Admitting: Family Medicine

## 2016-05-01 ENCOUNTER — Encounter: Payer: Self-pay | Admitting: Family Medicine

## 2016-05-01 VITALS — BP 100/60 | HR 61 | Ht 70.0 in | Wt 224.0 lb

## 2016-05-01 DIAGNOSIS — E1159 Type 2 diabetes mellitus with other circulatory complications: Secondary | ICD-10-CM | POA: Diagnosis not present

## 2016-05-01 DIAGNOSIS — I10 Essential (primary) hypertension: Secondary | ICD-10-CM | POA: Diagnosis not present

## 2016-05-01 DIAGNOSIS — E669 Obesity, unspecified: Secondary | ICD-10-CM | POA: Diagnosis not present

## 2016-05-01 DIAGNOSIS — E785 Hyperlipidemia, unspecified: Secondary | ICD-10-CM | POA: Diagnosis not present

## 2016-05-01 DIAGNOSIS — E1169 Type 2 diabetes mellitus with other specified complication: Secondary | ICD-10-CM

## 2016-05-01 DIAGNOSIS — E0840 Diabetes mellitus due to underlying condition with diabetic neuropathy, unspecified: Secondary | ICD-10-CM

## 2016-05-01 DIAGNOSIS — Z794 Long term (current) use of insulin: Secondary | ICD-10-CM

## 2016-05-01 DIAGNOSIS — I152 Hypertension secondary to endocrine disorders: Secondary | ICD-10-CM

## 2016-05-01 LAB — POCT UA - MICROALBUMIN: CREATININE, POC: 338.4 mg/dL

## 2016-05-01 LAB — POCT GLYCOSYLATED HEMOGLOBIN (HGB A1C): HEMOGLOBIN A1C: 7.6

## 2016-05-01 NOTE — Progress Notes (Signed)
  Subjective:    Patient ID: [name], male    DOB: Nov 18, 1930, 82 y.o.   MRN: [id number]  [name] is a 82 y.o. male who presents for follow-up of Type 2 diabetes mellitus.  Patient is checking home blood sugars.   Home blood sugar records 100 to 150 How often is blood sugars being checked: once a day Current symptoms/problems none Daily foot checks: yes   Any foot concerns: none Last eye exam: 10/2015 Dr.Groat Exercise: none  he gets most of his care from the [place]. They recently have him stop his niacin , increase the metformin. He also states that he is doing a better job with his diet. The following portions of the patient's history were reviewed and updated as appropriate: allergies, current medications, past medical history, past social history and problem list.  ROS as in subjective above.     Objective:    Physical Exam Alert and in no distress otherwise not examined.  There were no vitals taken for this visit.  Lab Review Diabetic Labs Latest Ref Rng 01/01/2016 08/16/2015 08/01/2015 07/19/2015 03/14/2015  HbA1c - 8.9 - - - -  Chol 125 - 200 mg/dL 179 - - - -  HDL >=40 mg/dL 27(L) - - - -  Calc LDL <130 mg/dL 115 - - - -  Triglycerides <150 mg/dL 183(H) - - - -  Creatinine 0.40 - 1.50 mg/dL - 1.35 1.51(H) 1.15(H) 1.28  GFR >60.00 mL/min - 53.57(L) 47.08(L) - -   BP/Weight 03/26/2016 01/03/2016 01/01/2016 0000000 [id number]  Systolic BP 0000000 [id number]  Diastolic BP 66 62 68 71 60  Wt. (Lbs) 229.8 233 231.2 233 236.8  BMI 32.97 33.43 33.17 33.43 33.98   Foot/eye exam completion dates Latest Ref Rng 01/03/2016 11/22/2014  Eye Exam No Retinopathy - No Retinopathy  Foot Form Completion - Done -    A1c is 7.6  [name]  reports that he quit smoking about 30 years ago. He has never used smokeless tobacco. He reports that he does not drink alcohol or use illicit drugs.     Assessment & Plan:    Diabetes mellitus due to underlying condition with diabetic neuropathy,  with long-term current use of insulin ([place]) - Plan: POCT glycosylated hemoglobin (Hb A1C), POCT UA - Microalbumin  Hypertension associated with diabetes ([place])  Hyperlipidemia associated with type 2 diabetes mellitus ([place])  Obesity (BMI 30-39.9)   1. Rx changes: none 2. Education: Reviewed 'ABCs' of diabetes management (respective goals in parentheses):  A1C (<7), blood pressure (<130/80), and cholesterol (LDL <100). 3. Compliance at present is estimated to be good. Efforts to improve compliance (if necessary) will be directed at increased exercise. 4. Follow up: 4 months  5.  recommend he come here twice per year and go to the [place] twice per year for his care. He is comfortable with that approach.

## 2016-09-09 ENCOUNTER — Encounter: Payer: Self-pay | Admitting: Family Medicine

## 2016-09-09 ENCOUNTER — Ambulatory Visit (INDEPENDENT_AMBULATORY_CARE_PROVIDER_SITE_OTHER): Payer: Medicare Other | Admitting: Family Medicine

## 2016-09-09 VITALS — BP 110/60 | HR 67 | Resp 16 | Wt 222.4 lb

## 2016-09-09 DIAGNOSIS — E118 Type 2 diabetes mellitus with unspecified complications: Secondary | ICD-10-CM | POA: Diagnosis not present

## 2016-09-09 DIAGNOSIS — C44321 Squamous cell carcinoma of skin of nose: Secondary | ICD-10-CM

## 2016-09-09 DIAGNOSIS — I10 Essential (primary) hypertension: Secondary | ICD-10-CM | POA: Diagnosis not present

## 2016-09-09 DIAGNOSIS — Z23 Encounter for immunization: Secondary | ICD-10-CM

## 2016-09-09 DIAGNOSIS — E0840 Diabetes mellitus due to underlying condition with diabetic neuropathy, unspecified: Secondary | ICD-10-CM

## 2016-09-09 DIAGNOSIS — E669 Obesity, unspecified: Secondary | ICD-10-CM | POA: Diagnosis not present

## 2016-09-09 DIAGNOSIS — E1169 Type 2 diabetes mellitus with other specified complication: Secondary | ICD-10-CM | POA: Diagnosis not present

## 2016-09-09 DIAGNOSIS — E1159 Type 2 diabetes mellitus with other circulatory complications: Secondary | ICD-10-CM | POA: Diagnosis not present

## 2016-09-09 DIAGNOSIS — I152 Hypertension secondary to endocrine disorders: Secondary | ICD-10-CM

## 2016-09-09 DIAGNOSIS — Z794 Long term (current) use of insulin: Secondary | ICD-10-CM | POA: Diagnosis not present

## 2016-09-09 DIAGNOSIS — E785 Hyperlipidemia, unspecified: Secondary | ICD-10-CM | POA: Diagnosis not present

## 2016-09-09 DIAGNOSIS — I2510 Atherosclerotic heart disease of native coronary artery without angina pectoris: Secondary | ICD-10-CM

## 2016-09-09 LAB — POCT GLYCOSYLATED HEMOGLOBIN (HGB A1C): Hemoglobin A1C: 6.9

## 2016-09-09 NOTE — Progress Notes (Signed)
Subjective:    Patient ID: [name], male    DOB: [birth year], 82 y.o.   MRN: [id number]  [name] is a 82 y.o. male who presents for follow-up of Type 2 diabetes mellitus.  Home blood sugar records: Not done. He checks them periodically. Current symptoms/problems include none and have been unchanged. Daily foot checks:   Any foot concerns: None Exercise: He was vague in his response and essentially is not exercising at all. Diet: He is not on a diabetic diet regimen. As go to the [place] and recently they did remove a cancerous lesion from his nose which was probably squamous cell. He is not complaining of chest pain, shortness of breath, reflux symptoms. He does not smoke or drink. He continues on medications listed in the chart and they were reviewed. The following portions of the patient's history were reviewed and updated as appropriate: allergies, current medications, past medical history, past social history and problem list.  ROS as in subjective above.     Objective:    Physical Exam Alert and in no distress otherwise not examined.  Blood pressure 110/60, pulse 67, resp. rate 16, weight 222 lb 6.4 oz (100.9 kg), SpO2 97 %.  Lab Review Diabetic Labs Latest Ref Rng & Units 09/09/2016 05/01/2016 01/01/2016 08/16/2015 08/01/2015  HbA1c - 6.9 7.6 8.9 - -  Microalbumin mg/L - >300.0 - - -  Micro/Creat Ratio - - >88.7 - - -  Chol 125 - 200 mg/dL - - 179 - -  HDL >=40 mg/dL - - 27(L) - -  Calc LDL <130 mg/dL - - 115 - -  Triglycerides <150 mg/dL - - 183(H) - -  Creatinine 0.40 - 1.50 mg/dL - - - 1.35 1.51(H)  GFR >60.00 mL/min - - - 53.57(L) 47.08(L)   BP/Weight 09/09/2016 05/01/2016 03/26/2016 01/03/2016 [id number]  Systolic BP [id number] 0000000 [id number]  Diastolic BP 60 60 66 62 68  Wt. (Lbs) 222.4 224 229.8 233 231.2  BMI 31.91 32.14 32.97 33.43 33.17   Foot/eye exam completion dates Latest Ref Rng & Units 01/03/2016 11/22/2014  Eye Exam No Retinopathy - No Retinopathy  Foot Form  Completion - Done -    [place]  reports that he quit smoking about 30 years ago. He has never used smokeless tobacco. He reports that he does not drink alcohol or use drugs.     Assessment & Plan:    Need for prophylactic vaccination and inoculation against influenza - Plan: Flu vaccine HIGH DOSE PF (Fluzone High dose)  Type 2 diabetes mellitus with complication, without long-term current use of insulin ([place]) - Plan: HgB A1c  Hypertension associated with diabetes (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  Compliance at present is estimated to be good. Efforts to improve compliance (if necessary) will be directed at increased exercise.  Follow up: 4 months   I encouraged him become more physically active. Recommended 20 minutes of something physical on a daily basis. Also recommend he follow-up at the [place] to get the shingles vaccine. He does state that they did do blood work on him several months ago. I do not  have a copy of it.

## 2016-09-09 NOTE — Patient Instructions (Signed)
Walk every day for at least 20 minutes but you can break it up into 5 or 10 minute increments

## 2016-10-01 ENCOUNTER — Ambulatory Visit (INDEPENDENT_AMBULATORY_CARE_PROVIDER_SITE_OTHER): Payer: Medicare Other | Admitting: Ophthalmology

## 2016-10-01 DIAGNOSIS — E11319 Type 2 diabetes mellitus with unspecified diabetic retinopathy without macular edema: Secondary | ICD-10-CM

## 2016-10-01 DIAGNOSIS — H35033 Hypertensive retinopathy, bilateral: Secondary | ICD-10-CM

## 2016-10-01 DIAGNOSIS — E113292 Type 2 diabetes mellitus with mild nonproliferative diabetic retinopathy without macular edema, left eye: Secondary | ICD-10-CM

## 2016-10-01 DIAGNOSIS — H43813 Vitreous degeneration, bilateral: Secondary | ICD-10-CM | POA: Diagnosis not present

## 2016-10-01 DIAGNOSIS — H353132 Nonexudative age-related macular degeneration, bilateral, intermediate dry stage: Secondary | ICD-10-CM

## 2016-10-01 DIAGNOSIS — I10 Essential (primary) hypertension: Secondary | ICD-10-CM | POA: Diagnosis not present

## 2017-01-05 ENCOUNTER — Ambulatory Visit: Payer: Medicare Other | Admitting: Family Medicine

## 2017-01-14 ENCOUNTER — Encounter: Payer: Self-pay | Admitting: Cardiology

## 2017-02-12 DEATH — deceased

## 2017-04-01 ENCOUNTER — Ambulatory Visit (INDEPENDENT_AMBULATORY_CARE_PROVIDER_SITE_OTHER): Payer: Medicare Other | Admitting: Ophthalmology

## 2017-04-09 ENCOUNTER — Encounter: Payer: Self-pay | Admitting: Family

## 2017-04-22 ENCOUNTER — Inpatient Hospital Stay (HOSPITAL_COMMUNITY): Admission: RE | Admit: 2017-04-22 | Payer: Medicare Other | Source: Ambulatory Visit

## 2017-04-22 ENCOUNTER — Ambulatory Visit: Payer: Medicare Other | Admitting: Family
